# Patient Record
Sex: Female | Born: 1940 | Race: White | Hispanic: No | Marital: Married | Smoking: Former smoker
Health system: Southern US, Community
[De-identification: ages and names within clinical notes are randomized; demographics above are authoritative.]

## PROBLEM LIST (undated history)

## (undated) DIAGNOSIS — Z6839 Body mass index (BMI) 39.0-39.9, adult: Secondary | ICD-10-CM

## (undated) DIAGNOSIS — E785 Hyperlipidemia, unspecified: Secondary | ICD-10-CM

## (undated) DIAGNOSIS — M7130 Other bursal cyst, unspecified site: Secondary | ICD-10-CM

## (undated) DIAGNOSIS — M1990 Unspecified osteoarthritis, unspecified site: Secondary | ICD-10-CM

## (undated) DIAGNOSIS — E669 Obesity, unspecified: Secondary | ICD-10-CM

## (undated) DIAGNOSIS — F419 Anxiety disorder, unspecified: Secondary | ICD-10-CM

## (undated) DIAGNOSIS — E119 Type 2 diabetes mellitus without complications: Secondary | ICD-10-CM

## (undated) DIAGNOSIS — F32A Depression, unspecified: Secondary | ICD-10-CM

## (undated) DIAGNOSIS — F329 Major depressive disorder, single episode, unspecified: Secondary | ICD-10-CM

## (undated) DIAGNOSIS — G4733 Obstructive sleep apnea (adult) (pediatric): Secondary | ICD-10-CM

## (undated) DIAGNOSIS — R002 Palpitations: Secondary | ICD-10-CM

## (undated) DIAGNOSIS — I10 Essential (primary) hypertension: Secondary | ICD-10-CM

## (undated) DIAGNOSIS — J449 Chronic obstructive pulmonary disease, unspecified: Secondary | ICD-10-CM

## (undated) DIAGNOSIS — M4300 Spondylolysis, site unspecified: Secondary | ICD-10-CM

## (undated) DIAGNOSIS — M797 Fibromyalgia: Secondary | ICD-10-CM

## (undated) HISTORY — DX: Unspecified osteoarthritis, unspecified site: M19.90

## (undated) HISTORY — DX: Depression, unspecified: F32.A

## (undated) HISTORY — DX: Type 2 diabetes mellitus without complications: E11.9

## (undated) HISTORY — DX: Other bursal cyst, unspecified site: M71.30

## (undated) HISTORY — PX: PILONIDAL CYST EXCISION: SHX744

## (undated) HISTORY — DX: Hyperlipidemia, unspecified: E78.5

## (undated) HISTORY — DX: Obstructive sleep apnea (adult) (pediatric): G47.33

## (undated) HISTORY — DX: Fibromyalgia: M79.7

## (undated) HISTORY — DX: Anxiety disorder, unspecified: F41.9

## (undated) HISTORY — DX: Obesity, unspecified: E66.9

## (undated) HISTORY — DX: Essential (primary) hypertension: I10

## (undated) HISTORY — PX: TONSILLECTOMY: SUR1361

## (undated) HISTORY — DX: Body mass index (BMI) 39.0-39.9, adult: Z68.39

## (undated) HISTORY — DX: Palpitations: R00.2

## (undated) HISTORY — DX: Spondylolysis, site unspecified: M43.00

## (undated) HISTORY — DX: Chronic obstructive pulmonary disease, unspecified: J44.9

## (undated) HISTORY — DX: Major depressive disorder, single episode, unspecified: F32.9

## (undated) HISTORY — PX: COLONOSCOPY: SHX174

## (undated) HISTORY — PX: LAPAROSCOPIC TUBAL LIGATION: SUR803

## (undated) HISTORY — PX: POLYPECTOMY: SHX149

---

## 1998-03-16 ENCOUNTER — Other Ambulatory Visit: Admission: RE | Admit: 1998-03-16 | Discharge: 1998-03-16 | Payer: Self-pay | Admitting: Internal Medicine

## 2001-06-12 ENCOUNTER — Encounter (INDEPENDENT_AMBULATORY_CARE_PROVIDER_SITE_OTHER): Payer: Self-pay | Admitting: Specialist

## 2001-06-12 ENCOUNTER — Ambulatory Visit (HOSPITAL_COMMUNITY): Admission: RE | Admit: 2001-06-12 | Discharge: 2001-06-12 | Payer: Self-pay | Admitting: Gastroenterology

## 2007-07-29 ENCOUNTER — Other Ambulatory Visit: Admission: RE | Admit: 2007-07-29 | Discharge: 2007-07-29 | Payer: Self-pay | Admitting: Internal Medicine

## 2007-08-27 ENCOUNTER — Encounter (INDEPENDENT_AMBULATORY_CARE_PROVIDER_SITE_OTHER): Payer: Self-pay | Admitting: *Deleted

## 2007-08-27 ENCOUNTER — Ambulatory Visit (HOSPITAL_COMMUNITY): Admission: RE | Admit: 2007-08-27 | Discharge: 2007-08-27 | Payer: Self-pay | Admitting: *Deleted

## 2009-02-18 ENCOUNTER — Encounter: Admission: RE | Admit: 2009-02-18 | Discharge: 2009-05-19 | Payer: Self-pay | Admitting: Otolaryngology

## 2009-02-26 ENCOUNTER — Encounter: Admission: RE | Admit: 2009-02-26 | Discharge: 2009-02-26 | Payer: Self-pay | Admitting: Otolaryngology

## 2010-06-09 ENCOUNTER — Encounter: Admission: RE | Admit: 2010-06-09 | Discharge: 2010-06-09 | Payer: Self-pay | Admitting: Internal Medicine

## 2011-05-02 NOTE — Op Note (Signed)
[name], [name] NO.:  [id number]   MEDICAL RECORD NO.:  [id number]          PATIENT TYPE:  AMB   LOCATION:  ENDO                         FACILITY:  [facility]   PHYSICIAN:  Georgiana Spinner, M.D.    DATE OF BIRTH:  1941-01-10   DATE OF PROCEDURE:  08/27/2007  DATE OF DISCHARGE:                               OPERATIVE REPORT   PROCEDURE:  Upper endoscopy.   INDICATIONS:  GERD.   ANESTHESIA:  Demerol 50 and Versed 8 mg.   DESCRIPTION OF PROCEDURE:  With the patient mildly sedated in the left  lateral decubitus position, the Pentax videoscopic endoscope was  inserted in the mouth and passed under direct vision through the  esophagus which appeared normal.  The squamocolumnar junction could  never be well seen despite maneuvers to open it up, so I elected to  biopsy one area that I thought could be Barrett's esophagus.  This was  photographed and biopsied.  We entered into the stomach.  The fundus,  body, antrum, duodenal bulb, and second portion of the duodenum all  appeared normal.  From this point, the endoscope was slowly withdrawn  taking circumferential views of the duodenal mucosa until the endoscope  had been pulled back in the stomach and placed in retroflexion to view  the stomach from below. The endoscope was straightened and withdrawn  taking circumferential views of the remaining gastric and esophageal  mucosa.  The patient's vital signs and pulse oximeter remained stable.  The patient tolerated the procedure well without apparent complications.   FINDINGS:  Question of Barrett's esophagus, biopsied, await biopsy  report.  The patient will call me for results and follow up with me as  an outpatient.  Proceed to colonoscopy.           ______________________________  Georgiana Spinner, M.D.     GMO/MEDQ  D:  08/27/2007  T:  08/27/2007  Job:  [id number]

## 2011-05-02 NOTE — Op Note (Signed)
Anne Juarez, Anne Juarez                 ACCOUNT NO.:  [id number]   MEDICAL RECORD NO.:  [id number]          PATIENT TYPE:  AMB   LOCATION:  ENDO                         FACILITY:  [facility]   PHYSICIAN:  Georgiana Spinner, M.D.    DATE OF BIRTH:  04/06/1941   DATE OF PROCEDURE:  08/27/2007  DATE OF DISCHARGE:                               OPERATIVE REPORT   PROCEDURE:  Colonoscopy.   ANESTHESIA:  Demerol 50 mg, Versed 3 mg.   DESCRIPTION OF PROCEDURE:  With the patient mildly sedated in the left  lateral decubitus position and subsequently rolled to her back, the  Pentax videoscopic colonoscope was inserted in the rectum and passed  under direct vision with pressure applied to reach the cecum identified  by the ileocecal valve and base of the cecum, both which were  photographed.  The prep was slightly suboptimal in that there were  scattered areas of small amounts of opaque brown liquidy stool that had  to be suctioned. From this point, the colonoscope was slowly withdrawn  taking circumferential views of the colonic mucosa stopping first in the  proximal transverse colon where a small polyp was seen, photographed,  and removed using hot biopsy forceps technique at a setting of 20/150  blended current.  We next stopped in the rectum which appeared normal  except for a small polyp. That, too, was removed using hot biopsy  forceps technique at the same setting and appeared normal on retroflexed  view. The endoscope was straightened and withdrawn.  The patient's vital  signs and pulse oximeter remained stable.  The patient tolerated the  procedure well without apparent complications.   FINDINGS:  Slight thickening of the sigmoid colon due to diverticular  disease.  The diverticular orifices were never well seen, themselves,  however.  A polyp of rectum and polyp of proximal transverse colon.  Await biopsy report.  The patient will call me for results and follow-up  with me as an outpatient.  The  prep was slightly suboptimal as noted  above.   PLAN:  Await biopsy report.  The patient will call me for results and  follow-up with me as an outpatient.           ______________________________  Georgiana Spinner, M.D.     GMO/MEDQ  D:  08/27/2007  T:  08/27/2007  Job:  [id number]

## 2011-05-02 NOTE — Op Note (Signed)
NAMEHENNIE, GOSA NO.:  [id number]   MEDICAL RECORD NO.:  [id number]          PATIENT TYPE:  AMB   LOCATION:  ENDO                         FACILITY:  [facility]   PHYSICIAN:  Georgiana Spinner, M.D.    DATE OF BIRTH:  [birth year]   DATE OF PROCEDURE:  08/27/2007  DATE OF DISCHARGE:                               OPERATIVE REPORT   ADDENDUM:  Colonoscopy report:  AVM's were noted in the cecum.           ______________________________  Georgiana Spinner, M.D.     GMO/MEDQ  D:  08/27/2007  T:  08/27/2007  Job:  [id number]

## 2011-05-05 NOTE — Procedures (Signed)
[facility]  Patient:    Anne Juarez, Anne Juarez                          MRN: [id number] Proc. Date: 06/12/01 Attending:  Everardo All. Madilyn Fireman, M.D. CC:         Janae Bridgeman. Eloise Harman., M.D.   Procedure Report  PROCEDURE:  Colonoscopy with polypectomy.  GASTROENTEROLOGIST:  Everardo All. Madilyn Fireman, M.D.  INDICATIONS FOR PROCEDURE:  History of adenomatous colon polyps on index colonoscopy four years ago.  DESCRIPTION OF PROCEDURE:   The patient was placed in the left lateral decubitus position and placed on the pulse monitor with continuous low-flow oxygen delivered by nasal cannula.  She was sedated with 80 mg IV Demerol and 8 mg IV Versed.  The Olympus video colonoscope was inserted into the rectum and advanced to the cecum, confirmed by transillumination of McBurneys point and visualization of the ileocecal valve and appendiceal orifice.  The prep was good.  The cecum, ascending, and transverse colon appeared normal with no masses, polyps, diverticula, or other mucosal abnormalities.  In the descending colon, there was seen a single 1 cm polyp which was fulgurated by hot biopsy.  The sigmoid colon appeared normal.  Within the proximal rectum, there was another 1 cm polyp which was fulgurated by hot biopsy, and the remainder of the rectum appeared normal except for several diminutive 3 mm or less in diameter polyps in the distal 5 to 6 cm which were felt to be hyperplastic and were not biopsied.  The scope was then withdrawn, and the patient returned to the recovery room in stable condition.  He tolerated the procedure well, and there were no immediate complications.  IMPRESSIONS: 1. Descending and rectal polyps. 2. Otherwise normal colonoscopy.  PLAN:  Repeat colonoscopy in three years. DD:  06/12/01 TD:  06/12/01 Job: [id number]

## 2011-12-27 DIAGNOSIS — E119 Type 2 diabetes mellitus without complications: Secondary | ICD-10-CM | POA: Diagnosis not present

## 2012-01-02 DIAGNOSIS — E7800 Pure hypercholesterolemia, unspecified: Secondary | ICD-10-CM | POA: Diagnosis not present

## 2012-01-02 DIAGNOSIS — K861 Other chronic pancreatitis: Secondary | ICD-10-CM | POA: Diagnosis not present

## 2012-01-02 DIAGNOSIS — R7301 Impaired fasting glucose: Secondary | ICD-10-CM | POA: Diagnosis not present

## 2012-01-02 DIAGNOSIS — I10 Essential (primary) hypertension: Secondary | ICD-10-CM | POA: Diagnosis not present

## 2012-08-27 DIAGNOSIS — E7800 Pure hypercholesterolemia, unspecified: Secondary | ICD-10-CM | POA: Diagnosis not present

## 2012-08-27 DIAGNOSIS — R7301 Impaired fasting glucose: Secondary | ICD-10-CM | POA: Diagnosis not present

## 2012-08-27 DIAGNOSIS — I10 Essential (primary) hypertension: Secondary | ICD-10-CM | POA: Diagnosis not present

## 2012-08-27 DIAGNOSIS — K861 Other chronic pancreatitis: Secondary | ICD-10-CM | POA: Diagnosis not present

## 2012-08-27 DIAGNOSIS — Z0000 Encounter for general adult medical examination without abnormal findings: Secondary | ICD-10-CM | POA: Diagnosis not present

## 2012-09-03 DIAGNOSIS — IMO0001 Reserved for inherently not codable concepts without codable children: Secondary | ICD-10-CM | POA: Diagnosis not present

## 2012-09-03 DIAGNOSIS — H908 Mixed conductive and sensorineural hearing loss, unspecified: Secondary | ICD-10-CM | POA: Diagnosis not present

## 2012-09-03 DIAGNOSIS — R7301 Impaired fasting glucose: Secondary | ICD-10-CM | POA: Diagnosis not present

## 2012-09-03 DIAGNOSIS — M545 Low back pain, unspecified: Secondary | ICD-10-CM | POA: Diagnosis not present

## 2012-09-03 DIAGNOSIS — E7800 Pure hypercholesterolemia, unspecified: Secondary | ICD-10-CM | POA: Diagnosis not present

## 2012-09-03 DIAGNOSIS — I10 Essential (primary) hypertension: Secondary | ICD-10-CM | POA: Diagnosis not present

## 2012-09-03 DIAGNOSIS — Z23 Encounter for immunization: Secondary | ICD-10-CM | POA: Diagnosis not present

## 2012-09-10 DIAGNOSIS — M810 Age-related osteoporosis without current pathological fracture: Secondary | ICD-10-CM | POA: Diagnosis not present

## 2012-09-13 ENCOUNTER — Encounter: Payer: Self-pay | Admitting: Internal Medicine

## 2012-10-26 DIAGNOSIS — Z23 Encounter for immunization: Secondary | ICD-10-CM | POA: Diagnosis not present

## 2012-10-29 ENCOUNTER — Ambulatory Visit (AMBULATORY_SURGERY_CENTER): Payer: Medicare Other

## 2012-10-29 VITALS — Ht 62.0 in | Wt 217.6 lb

## 2012-10-29 DIAGNOSIS — Z1211 Encounter for screening for malignant neoplasm of colon: Secondary | ICD-10-CM

## 2012-10-29 DIAGNOSIS — Z800 Family history of malignant neoplasm of digestive organs: Secondary | ICD-10-CM

## 2012-10-29 DIAGNOSIS — Z86010 Personal history of colonic polyps: Secondary | ICD-10-CM

## 2012-10-29 MED ORDER — MOVIPREP 100 G PO SOLR: ORAL | Status: DC

## 2012-11-12 ENCOUNTER — Ambulatory Visit (AMBULATORY_SURGERY_CENTER): Payer: Medicare Other | Admitting: Internal Medicine

## 2012-11-12 ENCOUNTER — Encounter: Payer: Self-pay | Admitting: Internal Medicine

## 2012-11-12 VITALS — BP 109/55 | HR 75 | Temp 97.1°F | Resp 15 | Ht 62.0 in | Wt 217.0 lb

## 2012-11-12 DIAGNOSIS — I10 Essential (primary) hypertension: Secondary | ICD-10-CM | POA: Diagnosis not present

## 2012-11-12 DIAGNOSIS — F329 Major depressive disorder, single episode, unspecified: Secondary | ICD-10-CM | POA: Diagnosis not present

## 2012-11-12 DIAGNOSIS — Z86010 Personal history of colonic polyps: Secondary | ICD-10-CM | POA: Diagnosis not present

## 2012-11-12 DIAGNOSIS — Z800 Family history of malignant neoplasm of digestive organs: Secondary | ICD-10-CM | POA: Diagnosis not present

## 2012-11-12 DIAGNOSIS — Z1211 Encounter for screening for malignant neoplasm of colon: Secondary | ICD-10-CM

## 2012-11-12 DIAGNOSIS — E785 Hyperlipidemia, unspecified: Secondary | ICD-10-CM | POA: Diagnosis not present

## 2012-11-12 DIAGNOSIS — F3289 Other specified depressive episodes: Secondary | ICD-10-CM | POA: Diagnosis not present

## 2012-11-12 MED ORDER — SODIUM CHLORIDE 0.9 % IV SOLN: 500.0000 mL | INTRAVENOUS | Status: DC

## 2012-11-12 NOTE — Op Note (Addendum)
[facility] [address] [place], [address]   COLONOSCOPY PROCEDURE REPORT  PATIENT: Anne Juarez, Anne Juarez  MR#: [id number] BIRTHDATE: 21-Jul-1941 , 70  yrs. old GENDER: Female ENDOSCOPIST: Hart Carwin, MD REFERRED BY:  Georgianne Fick, M.D. PROCEDURE DATE:  11/12/2012 PROCEDURE:   Colonoscopy, surveillance ASA CLASS:   Class II INDICATIONS:hyperplactic polyp and tubular adenoma  in 2008,. MEDICATIONS: MAC sedation, administered by CRNA and propofol (Diprivan) 300mg  IV  DESCRIPTION OF PROCEDURE:   After the risks and benefits and of the procedure were explained, informed consent was obtained.  A digital rectal exam revealed no abnormalities of the rectum.    The LB [id number]-[id number]  endoscope was introduced through the anus and advanced to the cecum, which was identified by both the appendix and ileocecal valve .  The quality of the prep was good, using MoviPrep .  The instrument was then slowly withdrawn as the colon was fully examined.     COLON FINDINGS: Moderate diverticulosis was noted.     Retroflexed views revealed no abnormalities.     The scope was then withdrawn from the patient and the procedure completed.  COMPLICATIONS: There were no complications. ENDOSCOPIC IMPRESSION: Moderate diverticulosis was noted  RECOMMENDATIONS: High fiber diet   REPEAT EXAM: In 10 year(s)  for Colonoscopy.  cc:  _______________________________ eSignedHart Carwin, MD 11/12/2012 11:10 AM

## 2012-11-12 NOTE — Progress Notes (Signed)
Patient did not experience any of the following events: a burn prior to discharge; a fall within the facility; wrong site/side/patient/procedure/implant event; or a hospital transfer or hospital admission upon discharge from the facility. (G8907) Patient did not have preoperative order for IV antibiotic SSI prophylaxis. (G8918)  

## 2012-11-12 NOTE — Patient Instructions (Signed)
HIGH FIBER DIET WITH LIBERAL FLUID INTAKE.  YOU HAD AN ENDOSCOPIC PROCEDURE TODAY AT THE [facility]: Refer to the procedure report that was given to you for any specific questions about what was found during the examination.  If the procedure report does not answer your questions, please call your gastroenterologist to clarify.  If you requested that your care partner not be given the details of your procedure findings, then the procedure report has been included in a sealed envelope for you to review at your convenience later.  YOU SHOULD EXPECT: Some feelings of bloating in the abdomen. Passage of more gas than usual.  Walking can help get rid of the air that was put into your GI tract during the procedure and reduce the bloating. If you had a lower endoscopy (such as a colonoscopy or flexible sigmoidoscopy) you may notice spotting of blood in your stool or on the toilet paper. If you underwent a bowel prep for your procedure, then you may not have a normal bowel movement for a few days.  DIET: Your first meal following the procedure should be a light meal and then it is ok to progress to your normal diet.  A half-sandwich or bowl of soup is an example of a good first meal.  Heavy or fried foods are harder to digest and may make you feel nauseous or bloated.  Likewise meals heavy in dairy and vegetables can cause extra gas to form and this can also increase the bloating.  Drink plenty of fluids but you should avoid alcoholic beverages for 24 hours.  ACTIVITY: Your care partner should take you home directly after the procedure.  You should plan to take it easy, moving slowly for the rest of the day.  You can resume normal activity the day after the procedure however you should NOT DRIVE or use heavy machinery for 24 hours (because of the sedation medicines used during the test).    SYMPTOMS TO REPORT IMMEDIATELY: A gastroenterologist can be reached at any hour.  During normal business  hours, 8:30 AM to 5:00 PM Monday through Friday, call [phone].  After hours and on weekends, please call the GI answering service at [phone] who will take a message and have the physician on call contact you.   Following lower endoscopy (colonoscopy or flexible sigmoidoscopy):  Excessive amounts of blood in the stool  Significant tenderness or worsening of abdominal pains  Swelling of the abdomen that is new, acute  Fever of 100F or higher FOLLOW UP: If any biopsies were taken you will be contacted by phone or by letter within the next 1-3 weeks.  Call your gastroenterologist if you have not heard about the biopsies in 3 weeks.  Our staff will call the home number listed on your records the next business day following your procedure to check on you and address any questions or concerns that you may have at that time regarding the information given to you following your procedure. This is a courtesy call and so if there is no answer at the home number and we have not heard from you through the emergency physician on call, we will assume that you have returned to your regular daily activities without incident.  SIGNATURES/CONFIDENTIALITY: You and/or your care partner have signed paperwork which will be entered into your electronic medical record.  These signatures attest to the fact that that the information above on your After Visit Summary has been reviewed and is understood.    Full responsibility of the confidentiality of this discharge information lies with you and/or your care-partner.  

## 2012-11-13 ENCOUNTER — Telehealth: Payer: Self-pay | Admitting: *Deleted

## 2012-11-13 NOTE — Telephone Encounter (Signed)
No answer, left message to call is questions or concerns. 

## 2012-11-18 DIAGNOSIS — H8110 Benign paroxysmal vertigo, unspecified ear: Secondary | ICD-10-CM | POA: Diagnosis not present

## 2012-11-18 DIAGNOSIS — R42 Dizziness and giddiness: Secondary | ICD-10-CM | POA: Diagnosis not present

## 2013-03-10 DIAGNOSIS — I10 Essential (primary) hypertension: Secondary | ICD-10-CM | POA: Diagnosis not present

## 2013-03-10 DIAGNOSIS — R7301 Impaired fasting glucose: Secondary | ICD-10-CM | POA: Diagnosis not present

## 2013-03-10 DIAGNOSIS — E7800 Pure hypercholesterolemia, unspecified: Secondary | ICD-10-CM | POA: Diagnosis not present

## 2013-03-10 DIAGNOSIS — IMO0001 Reserved for inherently not codable concepts without codable children: Secondary | ICD-10-CM | POA: Diagnosis not present

## 2013-03-17 DIAGNOSIS — R079 Chest pain, unspecified: Secondary | ICD-10-CM | POA: Diagnosis not present

## 2013-03-17 DIAGNOSIS — E7800 Pure hypercholesterolemia, unspecified: Secondary | ICD-10-CM | POA: Diagnosis not present

## 2013-03-17 DIAGNOSIS — R0602 Shortness of breath: Secondary | ICD-10-CM | POA: Diagnosis not present

## 2013-03-17 DIAGNOSIS — I10 Essential (primary) hypertension: Secondary | ICD-10-CM | POA: Diagnosis not present

## 2013-03-18 DIAGNOSIS — R0609 Other forms of dyspnea: Secondary | ICD-10-CM | POA: Diagnosis not present

## 2013-03-18 DIAGNOSIS — R0989 Other specified symptoms and signs involving the circulatory and respiratory systems: Secondary | ICD-10-CM | POA: Diagnosis not present

## 2013-03-18 DIAGNOSIS — R0602 Shortness of breath: Secondary | ICD-10-CM | POA: Diagnosis not present

## 2013-03-20 DIAGNOSIS — R0602 Shortness of breath: Secondary | ICD-10-CM | POA: Diagnosis not present

## 2013-04-07 DIAGNOSIS — I10 Essential (primary) hypertension: Secondary | ICD-10-CM | POA: Diagnosis not present

## 2013-04-07 DIAGNOSIS — E7800 Pure hypercholesterolemia, unspecified: Secondary | ICD-10-CM | POA: Diagnosis not present

## 2013-04-07 DIAGNOSIS — R0602 Shortness of breath: Secondary | ICD-10-CM | POA: Diagnosis not present

## 2013-04-21 ENCOUNTER — Encounter: Payer: Self-pay | Admitting: Internal Medicine

## 2013-04-22 ENCOUNTER — Ambulatory Visit (INDEPENDENT_AMBULATORY_CARE_PROVIDER_SITE_OTHER): Payer: Medicare Other | Admitting: Internal Medicine

## 2013-04-22 ENCOUNTER — Encounter: Payer: Self-pay | Admitting: Internal Medicine

## 2013-04-22 VITALS — BP 122/70 | HR 84 | Temp 98.1°F | Ht 62.0 in | Wt 218.2 lb

## 2013-04-22 DIAGNOSIS — R059 Cough, unspecified: Secondary | ICD-10-CM | POA: Diagnosis not present

## 2013-04-22 DIAGNOSIS — R05 Cough: Secondary | ICD-10-CM

## 2013-04-22 DIAGNOSIS — R0989 Other specified symptoms and signs involving the circulatory and respiratory systems: Secondary | ICD-10-CM | POA: Diagnosis not present

## 2013-04-22 DIAGNOSIS — R0609 Other forms of dyspnea: Secondary | ICD-10-CM | POA: Diagnosis not present

## 2013-04-22 DIAGNOSIS — R0600 Dyspnea, unspecified: Secondary | ICD-10-CM

## 2013-04-22 DIAGNOSIS — R053 Chronic cough: Secondary | ICD-10-CM

## 2013-04-22 NOTE — Patient Instructions (Addendum)
Please have PFT breathing test asap Please do oxygen test saturation at night on room air Return to see me next few weeks after above At followup  - do walk test for oxygen  - do cough score

## 2013-04-22 NOTE — Progress Notes (Signed)
Subjective:    Patient ID: Anne Juarez, female    DOB: Mar 23, 1941, 72 y.o.   MRN: [id number] PCP Georgianne Fick, MD    HPI IOV 04/22/2013 72 year old female. Ex 112 pack smoker. Quit smoking 2002/2003. Obese with BMI nearly 40.  Reports insidious onset of dyspnea few to several years. Marland Kitchen Dyspnea is progressive esp in last 3 months and more so last months. Currently gets dyspneic with even taking shower or even bending over and petting cat/dog.  There is some associated lightheadedness x 1 episode last week (bp check was normal but thinks she was tachycardic). She denies this severity of symptoims a year ago. Dyspnea improved by rest "sitting still" or "Stand still" and partially by albuerol. There is no orthopnea or pnd. Dyspnea is rated as moderate- severe  There is associated cough x 2 months. Cough stable since onset. Cough is reported as moderate. Quality is dry but recently clear mucus coming out.  There is no associated sinus drainge or gerd.    Both associated with wheeze, . Both not associated with tickle in throat or gagging.    Cough/dsypnea relevant hx  - Sinus   - denies hx for sinus issues - Has chronic hoarseness of voice  - GERD  - reportes active gerd esp after meals and lyuing supine. Rx is "not eat 2h before I got to bed". Not on drug Rx.  - she is noted to be on fish oil  - Pulmonary   - age 72 - 29 reports lot of "pneumonia or bronchitis". No dx of copd or asthma that is active   - General  - got obese after menopause. Progressive weight gain 100# in 22 years (slow and steady). Recollects being in great shape. . Unable to exercise to lose weight due to dyspnea  -   CXR 03/17/13 at pmd office - hyperfinlation (personally reviewed image)      Past Medical History  Diagnosis Date  . Hyperlipidemia   . Hypertension   . Anxiety   . Depression   . 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 Colon cancer Sister   . Liver cancer Brother   . Stomach cancer Paternal Aunt   . Esophageal cancer Neg Hx   . Rectal cancer Neg Hx      History   Social History  . Marital Status: Married    Spouse Name: N/A    Number of Children: N/A  . Years of Education: N/A   Occupational History  . Not on file.   Social History Main Topics  . Smoking status: Former Smoker -- 2.50 packs/day for 45 years    Types: Cigarettes    Quit date: 03/18/2002  . Smokeless tobacco: Never Used  . Alcohol Use: No  . Drug Use: No  . Sexually Active: Not on file   Other Topics Concern  . Not on file   Social History Narrative  . No narrative on file     No Known Allergies   Outpatient Prescriptions Prior to Visit  Medication Sig Dispense Refill  . amLODipine (NORVASC) 5 MG tablet Take 5 mg by mouth daily.      Marland Kitchen atorvastatin (LIPITOR) 40 MG tablet Take 40 mg by mouth daily.      . Cholecalciferol (VITAMIN D-3) 1000 UNITS CAPS Take by mouth daily.      Marland Kitchen  cyanocobalamin 1000 MCG tablet Take 100 mcg by mouth daily.      . cyclobenzaprine (FLEXERIL) 10 MG tablet Take 10 mg by mouth 3 (three) times daily as needed.       . DULoxetine (CYMBALTA) 30 MG capsule Take 30 mg by mouth daily.      . fish oil-omega-3 fatty acids 1000 MG capsule Take 2 g by mouth daily.      Marland Kitchen MAGNESIUM PO Take by mouth. Magnesium with zinc-Take 3 daily      . Milk Thistle 1000 MG CAPS Take by mouth daily.      . Multiple Vitamins-Minerals (CENTRUM SILVER PO) Take by mouth daily.      . Potassium Gluconate 2 MEQ TABS Take by mouth daily.      Marland Kitchen telmisartan-hydrochlorothiazide (MICARDIS HCT) 80-25 MG per tablet Take 1 tablet by mouth daily.      . Turmeric Curcumin 500 MG CAPS Take by mouth. Take 2 daily      . vitamin C (ASCORBIC ACID) 500 MG tablet Take 500 mg by mouth daily.      . vitamin E 400 UNIT capsule Take 400 Units by mouth daily.      . Coenzyme  Q10 (CO Q 10) 100 MG CAPS Take by mouth daily.      Marland Kitchen pyridoxine (B-6) 100 MG tablet Take 100 mg by mouth daily.       No facility-administered medications prior to visit.     Review of Systems  Constitutional: Positive for unexpected weight change. Negative for fever.  HENT: Negative for ear pain, nosebleeds, congestion, sore throat, rhinorrhea, sneezing, trouble swallowing, dental problem, postnasal drip and sinus pressure.   Eyes: Negative for redness and itching.  Respiratory: Positive for cough and shortness of breath. Negative for chest tightness and wheezing.   Cardiovascular: Positive for palpitations. Negative for leg swelling.  Gastrointestinal: Negative for nausea and vomiting.  Genitourinary: Negative for dysuria.  Musculoskeletal: Negative for joint swelling.  Skin: Negative for rash.  Neurological: Negative for headaches.  Hematological: Does not bruise/bleed easily.  Psychiatric/Behavioral: Positive for dysphoric mood. The patient is not nervous/anxious.        Objective:   Physical Exam  Vitals reviewed. Constitutional: She is oriented to person, place, and time. She appears well-developed and well-nourished. No distress.  Body mass index is 39.9 kg/(m^2).   HENT:  Head: Normocephalic and atraumatic.  Right Ear: External ear normal.  Left Ear: External ear normal.  Mouth/Throat: Oropharynx is clear and moist. No oropharyngeal exudate.  Squeaky voice  Eyes: Conjunctivae and EOM are normal. Pupils are equal, round, and reactive to light. Right eye exhibits no discharge. Left eye exhibits no discharge. No scleral icterus.  Neck: Normal range of motion. Neck supple. No JVD present. No tracheal deviation present. No thyromegaly present.  Cardiovascular: Normal rate, regular rhythm, normal heart sounds and intact distal pulses.  Exam reveals no gallop and no friction rub.   No murmur heard. Pulmonary/Chest: Effort normal and breath sounds normal. No respiratory  distress. She has no wheezes. She has no rales. She exhibits no tenderness.  Abdominal: Soft. Bowel sounds are normal. She exhibits no distension and no mass. There is no tenderness. There is no rebound and no guarding.  Musculoskeletal: Normal range of motion. She exhibits no edema and no tenderness.  Lymphadenopathy:    She has no cervical adenopathy.  Neurological: She is alert and oriented to person, place, and time. She has normal reflexes. No cranial nerve  deficit. She exhibits normal muscle tone. Coordination normal.  Skin: Skin is warm and dry. No rash noted. She is not diaphoretic. No erythema. No pallor.  Psychiatric: Judgment and thought content normal.  Anxious Flat affect          Assessment & Plan:

## 2013-04-23 ENCOUNTER — Ambulatory Visit (INDEPENDENT_AMBULATORY_CARE_PROVIDER_SITE_OTHER): Payer: Medicare Other | Admitting: Internal Medicine

## 2013-04-23 DIAGNOSIS — R0609 Other forms of dyspnea: Secondary | ICD-10-CM

## 2013-04-23 DIAGNOSIS — R0989 Other specified symptoms and signs involving the circulatory and respiratory systems: Secondary | ICD-10-CM | POA: Diagnosis not present

## 2013-04-23 DIAGNOSIS — R0600 Dyspnea, unspecified: Secondary | ICD-10-CM

## 2013-04-23 LAB — PULMONARY FUNCTION TEST
DL/VA % pred: 92 %
DL/VA: 3.93 ml/min/mmHg/L
DLCO unc % pred: 95 %
DLCO unc: 18.04 ml/min/mmHg
FEF 25-75 Post: 1.97 L/sec
FEF 25-75 Pre: 1.46 L/sec
FEF2575-%Change-Post: 34 %
FEF2575-%Pred-Post: 126 %
FEF2575-%Pred-Pre: 93 %
FEV1-%Change-Post: 7 %
FEV1-%Pred-Post: 109 %
FEV1-%Pred-Pre: 101 %
FEV1-Post: 2 L
FEV1-Pre: 1.86 L
FEV1FVC-%Change-Post: 3 %
FEV1FVC-%Pred-Pre: 101 %
FEV6-%Change-Post: 3 %
FEV6-%Pred-Post: 108 %
FEV6-%Pred-Pre: 105 %
FEV6-Post: 2.53 L
FEV6-Pre: 2.44 L
FEV6FVC-%Change-Post: 0 %
FEV6FVC-%Pred-Post: 104 %
FEV6FVC-%Pred-Pre: 104 %
FVC-%Change-Post: 3 %
FVC-%Pred-Post: 104 %
FVC-%Pred-Pre: 100 %
FVC-Post: 2.54 L
Post FEV1/FVC ratio: 79 %
Post FEV6/FVC ratio: 99 %
Pre FEV1/FVC ratio: 76 %
Pre FEV6/FVC Ratio: 100 %
RV % pred: 128 %
RV: 2.61 L
TLC % pred: 109 %
TLC: 4.88 L

## 2013-04-23 NOTE — Progress Notes (Signed)
PFT done today. 

## 2013-04-25 DIAGNOSIS — R05 Cough: Secondary | ICD-10-CM | POA: Insufficient documentation

## 2013-04-25 DIAGNOSIS — R0600 Dyspnea, unspecified: Secondary | ICD-10-CM | POA: Insufficient documentation

## 2013-04-25 DIAGNOSIS — R053 Chronic cough: Secondary | ICD-10-CM | POA: Insufficient documentation

## 2013-04-25 NOTE — Assessment & Plan Note (Signed)
Unclear etiology for dyspnea. Differential diagnoses include smoking-related diseases, deconditioning, obesity. In order to sort all this out I will obtain pulmonary function test on overnight oxygen to saturation test on room air. I will see her after these 2 tests regroup and her decide the next test. She is in agreement with this plan

## 2013-04-25 NOTE — Assessment & Plan Note (Signed)
I will check her cough score at followup

## 2013-04-29 ENCOUNTER — Telehealth: Payer: Self-pay | Admitting: Internal Medicine

## 2013-04-29 DIAGNOSIS — R0600 Dyspnea, unspecified: Secondary | ICD-10-CM

## 2013-04-29 DIAGNOSIS — R0602 Shortness of breath: Secondary | ICD-10-CM | POA: Diagnosis not present

## 2013-04-29 NOTE — Telephone Encounter (Signed)
Called, spoke with [facility] with [employer]. Mandy faxed over pt's ONO on RA this morning. Reports pt had 169 minutes below 88% and 255 minutes below 89%. Will route msg to MR.   Pls advise if you have results and if you would like to order o2.  Thank you.

## 2013-04-29 NOTE — Telephone Encounter (Signed)
pft 5/7'14 is completely normal. So puzzled she desaturates at night. To sort this out  A) please set up CT chest ILD  Protocol wo contrast  B) sleep doctor referral in our office  Fu to see me after Ct chest (she can see sleep doc whenever)  Dr. Kalman Shan, M.D., [facility].C.P Pulmonary and Critical Care Medicine Staff Physician [facility] Pager: [phone], If no answer or between  15:00h - 7:00h: call [phone]  04/29/2013 5:26 PM

## 2013-04-30 NOTE — Telephone Encounter (Signed)
Pt is aware that she will need to have a CT done and why. Anne Juarez has spoken to her and given her the details of when and where she will have it done.

## 2013-05-05 ENCOUNTER — Ambulatory Visit (INDEPENDENT_AMBULATORY_CARE_PROVIDER_SITE_OTHER)
Admission: RE | Admit: 2013-05-05 | Discharge: 2013-05-05 | Disposition: A | Discharge status: Home / Self Care | Payer: Medicare Other | Source: Ambulatory Visit | Attending: Internal Medicine | Admitting: Internal Medicine

## 2013-05-05 DIAGNOSIS — R0600 Dyspnea, unspecified: Secondary | ICD-10-CM

## 2013-05-05 DIAGNOSIS — R0609 Other forms of dyspnea: Secondary | ICD-10-CM | POA: Diagnosis not present

## 2013-05-05 DIAGNOSIS — R0989 Other specified symptoms and signs involving the circulatory and respiratory systems: Secondary | ICD-10-CM

## 2013-05-05 DIAGNOSIS — R918 Other nonspecific abnormal finding of lung field: Secondary | ICD-10-CM | POA: Diagnosis not present

## 2013-05-05 DIAGNOSIS — J438 Other emphysema: Secondary | ICD-10-CM | POA: Diagnosis not present

## 2013-05-20 ENCOUNTER — Encounter: Payer: Self-pay | Admitting: Pulmonary Disease

## 2013-05-20 ENCOUNTER — Ambulatory Visit (INDEPENDENT_AMBULATORY_CARE_PROVIDER_SITE_OTHER): Payer: Medicare Other | Admitting: Pulmonary Disease

## 2013-05-20 VITALS — BP 122/76 | HR 90 | Temp 97.9°F | Ht 62.0 in | Wt 219.8 lb

## 2013-05-20 DIAGNOSIS — J438 Other emphysema: Secondary | ICD-10-CM

## 2013-05-20 DIAGNOSIS — R911 Solitary pulmonary nodule: Secondary | ICD-10-CM | POA: Diagnosis not present

## 2013-05-20 DIAGNOSIS — R0902 Hypoxemia: Secondary | ICD-10-CM

## 2013-05-20 DIAGNOSIS — J439 Emphysema, unspecified: Secondary | ICD-10-CM | POA: Insufficient documentation

## 2013-05-20 DIAGNOSIS — J209 Acute bronchitis, unspecified: Secondary | ICD-10-CM | POA: Diagnosis not present

## 2013-05-20 DIAGNOSIS — G4734 Idiopathic sleep related nonobstructive alveolar hypoventilation: Secondary | ICD-10-CM | POA: Insufficient documentation

## 2013-05-20 MED ORDER — TIOTROPIUM BROMIDE MONOHYDRATE 18 MCG IN CAPS: 18.0000 ug | ORAL_CAPSULE | Freq: Every day | RESPIRATORY_TRACT | Status: DC

## 2013-05-20 MED ORDER — DOXYCYCLINE HYCLATE 100 MG PO CAPS: 100.0000 mg | ORAL_CAPSULE | Freq: Two times a day (BID) | ORAL | Status: DC

## 2013-05-20 MED ORDER — PREDNISONE 5 MG PO TABS: ORAL_TABLET | ORAL | Status: DC

## 2013-05-20 NOTE — Patient Instructions (Signed)
Prednisone 5 mg pill >> 4 pills for 2 days, 3 pills for 2 days, 2 pills for 2 days, 1 pill for 2 days Doxycycline 100 mg pill >> one pill twice per day for 7 days Spiriva one puff daily Proair two puffs up to four times per day as needed for cough, wheeze, or chest congestion Follow up with Dr. Marchelle Gearing in 1 to 2 weeks

## 2013-05-20 NOTE — Assessment & Plan Note (Signed)
Recent ONO showed nocturnal hypoxemia.  She is to follow up with Dr. Marchelle Gearing to discuss implications of this.

## 2013-05-20 NOTE — Assessment & Plan Note (Signed)
Will give course of prednisone and doxycycline. 

## 2013-05-20 NOTE — Assessment & Plan Note (Signed)
Reviewed CT chest findings with her.  She will likely need radiographic follow up >> this can be discussed in more detail at follow up with Dr. Marchelle Gearing.

## 2013-05-20 NOTE — Assessment & Plan Note (Signed)
Will give trial of spiriva >> gave samples.  She can continue prn albuterol.

## 2013-05-20 NOTE — Progress Notes (Signed)
Chief Complaint  Patient presents with  . Acute Visit    MR pt. Pt reports she has infection in her chest. She states she coughs a lot but gets very little phlem up up. she gets up yellow-grey phlem, she has SOB, wheezing and chest tx. Per pt she is getting worse.     History of Present Illness: Anne Juarez is a 72 y.o. female former smoker with chronic cough and dyspnea.  She is followed by Dr. Marchelle Gearing.  She has been feeling sick for the past 6 months.  She has cough that is usually dry.  Over the past two weeks she has cough productive of yellow to gray sputum.  She has been getting wheeze in her chest.  Her chest is sore when she coughs, and she will retch sometimes when she coughs.  She denies hemoptysis, headache, sinus congestion, reflux, or sore throat.  She has not had any sick exposures.  She has not smoked in years.  She used to get pneumonia frequently when she was younger.  She has an albuterol inhaler, but the benefit does not last very long.  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  Anne Juarez  has past surgical history that includes Laparoscopic tubal ligation; Tonsillectomy; Pilonidal cyst excision; Colonoscopy; and Polypectomy.  Prior to Admission medications   Medication Sig Start Date End Date Taking? Authorizing Provider  albuterol (PROAIR HFA) 108 (90 BASE) MCG/ACT inhaler Inhale 2 puffs into the lungs every 6 (six) hours as needed for wheezing.   Yes Historical Provider, MD  amLODipine (NORVASC) 5 MG tablet Take 5 mg by mouth daily.   Yes Historical Provider, MD  atorvastatin (LIPITOR) 40 MG tablet Take 40 mg by mouth daily.   Yes Historical Provider, MD  Cholecalciferol (VITAMIN D-3) 1000  UNITS CAPS Take by mouth daily.   Yes Historical Provider, MD  cyanocobalamin 1000 MCG tablet Take 100 mcg by mouth daily.   Yes Historical Provider, MD  cyclobenzaprine (FLEXERIL) 10 MG tablet Take 10 mg by mouth 3 (three) times daily as needed.    Yes Historical Provider, MD  DULoxetine (CYMBALTA) 30 MG capsule Take 30 mg by mouth daily.   Yes Historical Provider, MD  fish oil-omega-3 fatty acids 1000 MG capsule Take 2 g by mouth daily.   Yes Historical Provider, MD  MAGNESIUM PO Take by mouth. Magnesium with zinc-Take 3 daily   Yes Historical Provider, MD  Milk Thistle 1000 MG CAPS Take by mouth daily.   Yes Historical Provider, MD  Multiple Vitamins-Minerals (CENTRUM SILVER PO) Take by mouth daily.   Yes Historical Provider, MD  Potassium Gluconate 2 MEQ TABS Take by mouth daily.   Yes Historical Provider, MD  pyridoxine (B-6) 100 MG tablet Take 100 mg by mouth daily.   Yes Historical Provider, MD  telmisartan-hydrochlorothiazide (MICARDIS HCT) 80-25 MG per tablet Take 1 tablet by mouth daily.   Yes Historical Provider, MD  Turmeric Curcumin 500 MG CAPS Take by mouth. Take 2 daily   Yes Historical Provider, MD  vitamin C (ASCORBIC ACID) 500 MG tablet Take 500 mg by mouth daily.   Yes Historical Provider, MD  vitamin E 400 UNIT capsule Take 400 Units by mouth daily.   Yes Historical Provider,  MD    No Known Allergies   Physical Exam:  General - No distress ENT - No sinus tenderness, no oral exudate, no LAN Cardiac - s1s2 regular, no murmur Chest - coarse breath sounds with scattered rhonchi Back - No focal tenderness Abd - Soft, non-tender Ext - No edema Neuro - Normal strength Skin - No rashes Psych - normal mood, and behavior   Assessment/Plan:  Coralyn Helling, MD [facility] Pager:  [phone]

## 2013-05-30 ENCOUNTER — Encounter: Payer: Self-pay | Admitting: Internal Medicine

## 2013-06-02 ENCOUNTER — Encounter: Payer: Self-pay | Admitting: Internal Medicine

## 2013-06-02 ENCOUNTER — Telehealth: Payer: Self-pay | Admitting: Internal Medicine

## 2013-06-02 ENCOUNTER — Ambulatory Visit (INDEPENDENT_AMBULATORY_CARE_PROVIDER_SITE_OTHER): Payer: Medicare Other | Admitting: Internal Medicine

## 2013-06-02 VITALS — BP 134/78 | HR 75 | Temp 97.7°F | Ht 62.0 in | Wt 220.4 lb

## 2013-06-02 DIAGNOSIS — R911 Solitary pulmonary nodule: Secondary | ICD-10-CM | POA: Diagnosis not present

## 2013-06-02 DIAGNOSIS — I2584 Coronary atherosclerosis due to calcified coronary lesion: Secondary | ICD-10-CM | POA: Diagnosis not present

## 2013-06-02 DIAGNOSIS — R053 Chronic cough: Secondary | ICD-10-CM

## 2013-06-02 DIAGNOSIS — R0902 Hypoxemia: Secondary | ICD-10-CM

## 2013-06-02 DIAGNOSIS — J438 Other emphysema: Secondary | ICD-10-CM

## 2013-06-02 DIAGNOSIS — I2510 Atherosclerotic heart disease of native coronary artery without angina pectoris: Secondary | ICD-10-CM

## 2013-06-02 DIAGNOSIS — R05 Cough: Secondary | ICD-10-CM

## 2013-06-02 DIAGNOSIS — J439 Emphysema, unspecified: Secondary | ICD-10-CM

## 2013-06-02 DIAGNOSIS — R0609 Other forms of dyspnea: Secondary | ICD-10-CM | POA: Diagnosis not present

## 2013-06-02 DIAGNOSIS — R059 Cough, unspecified: Secondary | ICD-10-CM

## 2013-06-02 DIAGNOSIS — G4734 Idiopathic sleep related nonobstructive alveolar hypoventilation: Secondary | ICD-10-CM

## 2013-06-02 DIAGNOSIS — R0989 Other specified symptoms and signs involving the circulatory and respiratory systems: Secondary | ICD-10-CM

## 2013-06-02 DIAGNOSIS — R0600 Dyspnea, unspecified: Secondary | ICD-10-CM

## 2013-06-02 MED ORDER — TIOTROPIUM BROMIDE MONOHYDRATE 18 MCG IN CAPS: 18.0000 ug | ORAL_CAPSULE | Freq: Every day | RESPIRATORY_TRACT | Status: DC

## 2013-06-02 NOTE — Progress Notes (Signed)
Subjective:    Patient ID: Anne Juarez, female    DOB: 1941/02/05, 72 y.o.   MRN: [id number]  HPI  PCP Anne Fick, MD   IOV 04/22/13 72 year old female. Ex 112 pack smoker. Quit smoking 2002/2003. Obese with BMI nearly 40.  Reports insidious onset of dyspnea few to several years. Marland Kitchen Dyspnea is progressive esp in last 3 months and more so last months. Currently gets dyspneic with even taking shower or even bending over and petting cat/dog.  There is some associated lightheadedness x 1 episode last week (bp check was normal but thinks she was tachycardic). She denies this severity of symptoims a year ago. Dyspnea improved by rest "sitting still" or "Stand still" and partially by albuerol. There is no orthopnea or pnd. Dyspnea is rated as moderate- severe  There is associated cough x 2 months. Cough stable since onset. Cough is reported as moderate. Quality is dry but recently clear mucus coming out.  There is no associated sinus drainge or gerd.    Both associated with wheeze, . Both not associated with tickle in throat or gagging.    Cough/dsypnea relevant hx  - Sinus   - denies hx for sinus issues - Has chronic hoarseness of voice  - GERD  - reportes active gerd esp after meals and lyuing supine. Rx is "not eat 2h before I got to bed". Not on drug Rx.  - she is noted to be on fish oil  - Pulmonary   - age 26 - 29 reports lot of "pneumonia or bronchitis". No dx of copd or asthma that is active   - General  - got obese after menopause. Progressive weight gain 100# in 22 years (slow and steady). Recollects being in great shape. . Unable to exercise to lose weight due to dyspnea  -   CXR 03/17/13 at pmd office - hyperfinlation (personally reviewed image)  REC Please have PFT breathing test asap Please do oxygen test saturation at night on room air Return to see me next few weeks after above At followup  - do walk test for oxygen  - do cough score   Telephone call  04/29/13 pft 5/7'14 is completely normal. So puzzled she desaturates at night. To sort this out  A) please set up CT chest ILD Protocol wo contrast  B) sleep doctor referral in our office  Fu to see me after Ct chest (she can see sleep doc whenever)      Acute OV 05/20/13: Anne Juarez  Anne Juarez is a 72 y.o. female former smoker with chronic cough and dyspnea.  She is followed by Anne. Marchelle Juarez.  She has been feeling sick for the past 6 months.  She has cough that is usually dry.  Over the past two weeks she has cough productive of yellow to gray sputum.  She has been getting wheeze in her chest.  Her chest is sore when she coughs, and she will retch sometimes when she coughs.  She denies hemoptysis, headache, sinus congestion, reflux, or sore throat.  She has not had any sick exposures.  She has not smoked in years.  She used to get pneumonia frequently when she was younger.  She has an albuterol inhaler, but the benefit does not last very long.  PFT 04/23/13 >> FEV1 2.0 (109%), FEV1% 79, TLC 4.48 (109%), DLCO 95% CT chest 05/14/13 >> mild centrilobular emphysema, coronary calcifications, 6 mm RML nodule   REC Prednisone 5 mg pill >> 4 pills for 2  days, 3 pills for 2 days, 2 pills for 2 days, 1 pill for 2 days Doxycycline 100 mg pill >> one pill twice per day for 7 days Spiriva one puff daily Proair two puffs up to four times per day as needed for cough, wheeze, or chest congestion Follow up with Anne. Marchelle Juarez in 1 to 2 weeks   OV 06/02/2013 She is presenting after starting dyspnea workup.  Dyspnea and cough persist. Empiric Spiriva helping only somewhat.  SOme better after spiriva given by Anne Juarez. Currently RSI cough score is 22 and suggestive of irritable larynx syndrome [details below]  CT shows lung nodule 6mm RML, co calcifications and mild emphysema   PFT 04/23/13 >> FEV1 2.0 (109%), FEV1% 79, TLC 4.48 (109%), DLCO 95%  Overnight desaturation test mid May 2014: ONO on RA  this morning. Reports pt had 169 minutes below 88% and 255 minutes below 89%. She reports significant associated snoring. Had set her up to see Anne Juarez for sleep evaluation but it so ended up that her most recent visit with him was for acute respiratory symptomatology so the sleep part was not evaluated   Anne Juarez Reflux Symptom Index (> 13-15 suggestive of LPR cough) 0 -> 5  =  none ->severe problem 06/02/2013   Hoarseness of problem with voice 0  Clearing  Of Throat 0  Excess throat mucus or feeling of post nasal drip 1  Difficulty swallowing food, liquid or tablets 2  Cough after eating or lying down 3  Breathing difficulties or choking episodes 5  Troublesome or annoying cough 5  Sensation of something sticking in throat or lump in throat 3  Heartburn, chest pain, indigestion, or stomach acid coming up 3  TOTAL 22     Kouffman Reflux v Neurogenic Cough Differentiator Reflux 06/02/13  Do you awaken from a sound sleep coughing violently?                            With trouble breathing? n  Do you have choking episodes when you cannot  Get enough air, gasping for air ?              n  Do you usually cough when you lie down into  The bed, or when you just lie down to rest ?                          n  Do you usually cough after meals or eating?         n  Do you cough when (or after) you bend over?    n  GERD SCORE  0  Kouffman Reflux v Neurogenic Cough Differentiator Neurogenic  Do you more-or-less cough all day long? n  Does change of temperature make you cough? n  Does laughing or chuckling cause you to cough? y  Do fumes (perfume, automobile fumes, burned  Toast, etc.,) cause you to cough ?      y  Does speaking, singing, or talking on the phone cause you to cough   ?               n  Neurogenic/Airway score 2     Review of Systems  Constitutional: Negative for fever and unexpected weight change.  HENT: Negative for ear pain, nosebleeds, congestion, sore  throat, rhinorrhea, sneezing, trouble swallowing, dental problem, postnasal drip and sinus pressure.  Eyes: Negative for redness and itching.  Respiratory: Negative for cough, chest tightness, shortness of breath and wheezing.   Cardiovascular: Negative for palpitations and leg swelling.  Gastrointestinal: Negative for nausea and vomiting.  Genitourinary: Negative for dysuria.  Musculoskeletal: Negative for joint swelling.  Skin: Negative for rash.  Neurological: Negative for headaches.  Hematological: Does not bruise/bleed easily.  Psychiatric/Behavioral: Negative for dysphoric mood. The patient is not nervous/anxious.        Objective:   Physical Exam  Vitals reviewed. Constitutional: She is oriented to person, place, and time. She appears well-developed and well-nourished. No distress.  Body mass index is 40.3 kg/(m^2).   HENT:  Head: Normocephalic and atraumatic.  Right Ear: External ear normal.  Left Ear: External ear normal.  Mouth/Throat: Oropharynx is clear and moist. No oropharyngeal exudate.  Eyes: Conjunctivae and EOM are normal. Pupils are equal, round, and reactive to light. Right eye exhibits no discharge. Left eye exhibits no discharge. No scleral icterus.  Neck: Normal range of motion. Neck supple. No JVD present. No tracheal deviation present. No thyromegaly present.  Cardiovascular: Normal rate, regular rhythm, normal heart sounds and intact distal pulses.  Exam reveals no gallop and no friction rub.   No murmur heard. Pulmonary/Chest: Effort normal and breath sounds normal. No respiratory distress. She has no wheezes. She has no rales. She exhibits no tenderness.  Abdominal: Soft. Bowel sounds are normal. She exhibits no distension and no mass. There is no tenderness. There is no rebound and no guarding.  Musculoskeletal: Normal range of motion. She exhibits no edema and no tenderness.  Lymphadenopathy:    She has no cervical adenopathy.  Neurological: She is  alert and oriented to person, place, and time. She has normal reflexes. No cranial nerve deficit. She exhibits normal muscle tone. Coordination normal.  Skin: Skin is warm and dry. No rash noted. She is not diaphoretic. No erythema. No pallor.  Psychiatric: She has a normal mood and affect. Her behavior is normal. Judgment and thought content normal.        Assessment & Plan:

## 2013-06-02 NOTE — Patient Instructions (Addendum)
#  Lung nodule  - 6 mm see in May 2014  - Do followup CT chest in Jan 2015  #Coronary artery calcification  - see DR Jacinto Halim for this; referred you  #Shortness of breath - Rule out angina equivalent; so referred to Dr. Jeanella Cara cardiology  - further evaluation after seeing Dr Jacinto Halim - if cardiac evaluation negative will do bike pulmonary stress test  #Cough  - do cough score now with my CMA  - continue spiriva  - stop fish oil - start OTC prilosec 20mg  daily on empty stomach  #Mild emphysema  - continue spiriva daily  #Snoring and nocturnal desaturations  - I will talk to Dr Craige Cotta and see about setting sleep study  #Followup  - 6 to 8 weeks with cough score at fu

## 2013-06-02 NOTE — Telephone Encounter (Signed)
I saw her for acute visit.  I saw you had requested for sleep evaluation, but this was not scheduled.  I did not know if she had sleep consult already scheduled.  If not, then please enter this.

## 2013-06-02 NOTE — Telephone Encounter (Signed)
VS  Between my office visit and your office vist there was a phone note where I wanted her to see you due to noctural desaturations. I think that got missed in commuinication to you. She is BMI 40, snores and desats at niught. Can you order sleep study and see her back for fu?  Dr. Kalman Shan, M.D., [facility]C.P Pulmonary and Critical Care Medicine Staff Physician [facility] Pager: [phone], If no answer or between  15:00h - 7:00h: call [phone]  06/02/2013 2:33 PM

## 2013-06-03 ENCOUNTER — Telehealth: Payer: Self-pay | Admitting: Internal Medicine

## 2013-06-03 NOTE — Telephone Encounter (Signed)
Spoke with the pt and scheduled her for sleep eval with VS on 07/01/13 at 4 pm Pt aware to arrive 15 min prior to fill out sleep form

## 2013-06-03 NOTE — Telephone Encounter (Signed)
[id number] x1 

## 2013-06-03 NOTE — Telephone Encounter (Signed)
Pt returned call. Anne Juarez °

## 2013-06-03 NOTE — Assessment & Plan Note (Signed)
Currently pulmonary function test is normal and CT scan only shows mild emphysema but she has coronary artery calcifications Rule out angina equivalent due to presence of coronary artery calcifications. So I have referred her to Dr. Jeanella Cara. If his cardiac evaluation is negative then I will do a bike pulmonary stress test. In the absence of angina equivalent I would suspect obesity, deconditioning and diastolic dysfunction as common and likely etiologies for dyspnea

## 2013-06-03 NOTE — Assessment & Plan Note (Signed)
 #  Mild emphysema  - continue spiriva daily

## 2013-06-03 NOTE — Telephone Encounter (Signed)
Pt returned call from triage. [phone]. Anne Juarez

## 2013-06-03 NOTE — Assessment & Plan Note (Signed)
This is associated with snoring. Her PFT is normal especially diffusion. CT scan chest only shows mild emphysema. I suspect nocturnal hypoxemia is due to sleep apnea. I discussed the Dr Coralyn Helling and we felt the best option is for her to do a sleep medicine consultation with him. I will communicate this with her

## 2013-06-03 NOTE — Assessment & Plan Note (Signed)
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

## 2013-06-03 NOTE — Assessment & Plan Note (Signed)
#  Lung nodule  - 6 mm see in May 2014  - Do followup CT chest in Jan 2015

## 2013-06-03 NOTE — Telephone Encounter (Signed)
Please let her know that I discussed with Dr Coralyn Helling and that we agreed that she should see him for a formal sleep apnea evaluation first in his office. Most recently she saw him and is confusion as to why she saw him but this was for acute respiratory issues.

## 2013-07-01 ENCOUNTER — Ambulatory Visit (INDEPENDENT_AMBULATORY_CARE_PROVIDER_SITE_OTHER): Payer: Medicare Other | Admitting: Pulmonary Disease

## 2013-07-01 ENCOUNTER — Encounter: Payer: Self-pay | Admitting: Pulmonary Disease

## 2013-07-01 VITALS — BP 142/80 | HR 91 | Temp 97.7°F | Ht 62.0 in | Wt 221.4 lb

## 2013-07-01 DIAGNOSIS — R0609 Other forms of dyspnea: Secondary | ICD-10-CM

## 2013-07-01 DIAGNOSIS — R0989 Other specified symptoms and signs involving the circulatory and respiratory systems: Secondary | ICD-10-CM

## 2013-07-01 DIAGNOSIS — G4733 Obstructive sleep apnea (adult) (pediatric): Secondary | ICD-10-CM

## 2013-07-01 DIAGNOSIS — R0683 Snoring: Secondary | ICD-10-CM

## 2013-07-01 HISTORY — DX: Obstructive sleep apnea (adult) (pediatric): G47.33

## 2013-07-01 NOTE — Progress Notes (Signed)
Chief Complaint  Patient presents with  . Sleep Consult    pt reports she is not getting enough o2 while she is sleeping, states not getting enough rest very tired thorughout the day EPWORTH SCORE:10    History of Present Illness: Anne Juarez is a 72 y.o. female for evaluation of sleep problems.  She is followed by Dr. Marchelle Gearing for emphysema.  She had recently overnight oximetry which showed oxygenation desaturation pattern suggestive of sleep apnea.  She reports snoring, sleep disruption, and witnessed apnea.  She is referred for further evaluation of sleep apnea.  She goes to sleep at 11 pm.  She falls asleep in 15 minutes.  She wakes up 2 times to use the bathroom.  She gets out of bed at 630.  She feels tired in the morning.  She denies morning headache.  She does not use anything to help her fall sleep or stay awake.  She can't sleep on her back >> feels like she can't breath.  Her mouth gets dry when a sleep.  She is tired all the time.  She feels likes she can't focus, and has trouble with her memory.  She denies sleep walking, sleep talking, bruxism, or nightmares.  There is no history of restless legs.  She denies sleep hallucinations, sleep paralysis, or cataplexy.  The Epworth score is 10 out of 24.  Tests: PFT 04/23/13 >> FEV1 2.0 (109%), FEV1% 79, TLC 4.48 (109%), DLCO 95%  ONO with RA 04/27/13 >> Test time 9 hrs 28 min.  Baseline SpO2 97%, low SpO2 54%.  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  Anne Juarez  has past surgical history that includes Laparoscopic tubal ligation; Tonsillectomy; Pilonidal cyst excision; Colonoscopy; and Polypectomy.  Prior to Admission medications   Medication Sig Start Date End Date Taking? Authorizing Provider   albuterol (PROAIR HFA) 108 (90 BASE) MCG/ACT inhaler Inhale 2 puffs into the lungs every 6 (six) hours as needed for wheezing.   Yes Historical Provider, MD  amLODipine (NORVASC) 5 MG tablet Take 5 mg by mouth daily.   Yes Historical Provider, MD  atorvastatin (LIPITOR) 40 MG tablet Take 40 mg by mouth daily.   Yes Historical Provider, MD  Cholecalciferol (VITAMIN D-3) 1000 UNITS CAPS Take by mouth daily.   Yes Historical Provider, MD  cyanocobalamin 1000 MCG tablet Take 100 mcg by mouth daily.   Yes Historical Provider, MD  cyclobenzaprine (FLEXERIL) 10 MG tablet Take 10 mg by mouth 3 (three) times daily as needed.    Yes Historical Provider, MD  DULoxetine (CYMBALTA) 30 MG capsule Take 30 mg by mouth daily.   Yes Historical Provider, MD  fish oil-omega-3 fatty acids 1000 MG capsule Take 2 g by mouth daily.   Yes Historical Provider, MD  MAGNESIUM PO Take by mouth. Magnesium with zinc-Take 3 daily   Yes Historical Provider, MD  Milk Thistle 1000 MG CAPS Take by mouth daily.   Yes Historical Provider, MD  Multiple Vitamins-Minerals (CENTRUM SILVER PO) Take by mouth daily.   Yes Historical Provider, MD  Potassium Gluconate 2 MEQ TABS Take by mouth daily.   Yes Historical Provider, MD  pyridoxine (B-6) 100 MG tablet Take 100 mg by mouth daily.   Yes Historical Provider, MD  telmisartan-hydrochlorothiazide (MICARDIS HCT) 80-25  MG per tablet Take 1 tablet by mouth daily.   Yes Historical Provider, MD  tiotropium (SPIRIVA) 18 MCG inhalation capsule Place 1 capsule (18 mcg total) into inhaler and inhale daily. 06/02/13  Yes Kalman Shan, MD  Turmeric Curcumin 500 MG CAPS Take by mouth. Take 2 daily   Yes Historical Provider, MD  vitamin C (ASCORBIC ACID) 500 MG tablet Take 500 mg by mouth daily.   Yes Historical Provider, MD  vitamin E 400 UNIT capsule Take 400 Units by mouth daily.   Yes Historical Provider, MD    No Known Allergies  Her family history includes Colon cancer in her sister; Liver  cancer in her brother; and Stomach cancer in her paternal aunt.  There is no history of Esophageal cancer and Rectal cancer.  She  reports that she quit smoking about 11 years ago. Her smoking use included Cigarettes. She has a 112.5 pack-year smoking history. She has never used smokeless tobacco. She reports that she does not drink alcohol or use illicit drugs.  Review of Systems  Constitutional: Negative for fever and unexpected weight change.  HENT: Negative for ear pain, nosebleeds, congestion, sore throat, rhinorrhea, sneezing, trouble swallowing, dental problem, postnasal drip and sinus pressure.   Eyes: Negative for redness and itching.  Respiratory: Positive for cough and shortness of breath. Negative for chest tightness and wheezing.   Cardiovascular: Negative for palpitations and leg swelling.  Gastrointestinal: Negative for nausea and vomiting.  Genitourinary: Negative for dysuria.  Musculoskeletal: Negative for joint swelling.  Skin: Positive for rash.  Neurological: Negative for headaches.  Hematological: Does not bruise/bleed easily.  Psychiatric/Behavioral: Negative for dysphoric mood. The patient is not nervous/anxious.    Physical Exam:  General - No distress ENT - No sinus tenderness, no oral exudate, MP 2, no LAN, no thyromegaly, TM clear, pupils equal/reactive Cardiac - s1s2 regular, no murmur, pulses symmetric Chest - No wheeze/rales/dullness, good air entry, normal respiratory excursion Back - No focal tenderness Abd - Soft, non-tender, no organomegaly, + bowel sounds Ext - No edema Neuro - Normal strength, cranial nerves intact Skin - No rashes Psych - Normal mood, and behavior

## 2013-07-01 NOTE — Patient Instructions (Signed)
Will arrange for sleep study Will call to arrange for follow up after sleep study reviewed 

## 2013-07-01 NOTE — Progress Notes (Deleted)
  Subjective:    Patient ID: Anne Juarez, female    DOB: [birth year], 72 y.o.   MRN: [id number]  HPI    Review of Systems  Constitutional: Negative for fever and unexpected weight change.  HENT: Negative for ear pain, nosebleeds, congestion, sore throat, rhinorrhea, sneezing, trouble swallowing, dental problem, postnasal drip and sinus pressure.   Eyes: Negative for redness and itching.  Respiratory: Positive for cough and shortness of breath. Negative for chest tightness and wheezing.   Cardiovascular: Negative for palpitations and leg swelling.  Gastrointestinal: Negative for nausea and vomiting.  Genitourinary: Negative for dysuria.  Musculoskeletal: Negative for joint swelling.  Skin: Positive for rash.  Neurological: Negative for headaches.  Hematological: Does not bruise/bleed easily.  Psychiatric/Behavioral: Negative for dysphoric mood. The patient is not nervous/anxious.        Objective:   Physical Exam        Assessment & Plan:

## 2013-07-01 NOTE — Assessment & Plan Note (Signed)
She has snoring, sleep disruption, witnessed apnea, and daytime sleepiness.  She has history of hypertension, depression and emphysema.  Recent overnight oximetry showed significant oxygen desaturation with pattern suggestive of sleep apnea.  I am concerned she could have obstructive sleep apnea.  We discussed how sleep apnea can affect various health problems including risks for hypertension, cardiovascular disease, and diabetes.  We also discussed how sleep disruption can increase risks for accident, such as while driving.  Weight loss as a means of improving sleep apnea was also reviewed.  Additional treatment options discussed were CPAP therapy, oral appliance, and surgical intervention.  To further assess will arrange for in lab sleep study.

## 2013-07-07 DIAGNOSIS — R0609 Other forms of dyspnea: Secondary | ICD-10-CM | POA: Diagnosis not present

## 2013-07-07 DIAGNOSIS — I2510 Atherosclerotic heart disease of native coronary artery without angina pectoris: Secondary | ICD-10-CM | POA: Diagnosis not present

## 2013-07-07 DIAGNOSIS — R0989 Other specified symptoms and signs involving the circulatory and respiratory systems: Secondary | ICD-10-CM | POA: Diagnosis not present

## 2013-07-07 DIAGNOSIS — E662 Morbid (severe) obesity with alveolar hypoventilation: Secondary | ICD-10-CM | POA: Diagnosis not present

## 2013-07-07 DIAGNOSIS — R079 Chest pain, unspecified: Secondary | ICD-10-CM | POA: Diagnosis not present

## 2013-07-14 ENCOUNTER — Ambulatory Visit: Payer: Medicare Other | Admitting: Internal Medicine

## 2013-07-16 ENCOUNTER — Telehealth: Payer: Self-pay | Admitting: Internal Medicine

## 2013-07-16 NOTE — Telephone Encounter (Signed)
Ok fine  Dr. Kalman Shan, M.D., [facility].C.P Pulmonary and Critical Care Medicine Staff Physician [facility] [facility] Pager: [phone], If no answer or between  15:00h - 7:00h: call [phone]  07/16/2013 10:19 AM

## 2013-07-16 NOTE — Telephone Encounter (Signed)
Will forward to Dr. Marchelle Gearing as an Lorain Childes

## 2013-08-04 ENCOUNTER — Ambulatory Visit (HOSPITAL_BASED_OUTPATIENT_CLINIC_OR_DEPARTMENT_OTHER): Payer: Medicare Other | Attending: Pulmonary Disease

## 2013-08-04 DIAGNOSIS — R0683 Snoring: Secondary | ICD-10-CM

## 2013-08-04 DIAGNOSIS — G4733 Obstructive sleep apnea (adult) (pediatric): Secondary | ICD-10-CM | POA: Insufficient documentation

## 2013-08-08 ENCOUNTER — Telehealth: Payer: Self-pay | Admitting: Pulmonary Disease

## 2013-08-08 ENCOUNTER — Encounter: Payer: Self-pay | Admitting: Pulmonary Disease

## 2013-08-08 DIAGNOSIS — G4733 Obstructive sleep apnea (adult) (pediatric): Secondary | ICD-10-CM | POA: Diagnosis not present

## 2013-08-08 NOTE — Telephone Encounter (Signed)
PSG 08/04/13 >> AHI 59.6, SpO2 low 83%, PLMI 0.  Will have my nurse arrange for ROV to review results in more detail.

## 2013-08-08 NOTE — Procedures (Signed)
NAMEZENOBIA, KUENNEN NO.:  [id number]  MEDICAL RECORD NO.:  [id number]          PATIENT TYPE:  OUT  LOCATION:  SLEEP CENTER                 FACILITY:  [facility]  PHYSICIAN:  Coralyn Helling, MD        DATE OF BIRTH:  November 14, 1941  DATE OF STUDY:  08/04/2013                           NOCTURNAL POLYSOMNOGRAM  REFERRING PHYSICIAN:  Coralyn Helling, MD  FACILITY:  [facility]  INDICATION FOR STUDY:  Ms. Greis is a 72 year old female, who has a history of hypertension and depression.  She also has snoring, sleep disruption, and daytime sleepiness.  She is referred to sleep lab for evaluation of hypersomnia with obstructive sleep apnea.  Height is 5 feet 2 inches, weight is 221 pounds.  BMI is 40.  Neck size is 16.5 inches.  Medications are reviewed and in her chart.  EPWORTH SLEEPINESS SCORE:  3.  SLEEP ARCHITECTURE:  Total recording time was 367 minutes.  Total sleep time was 140 minutes.  Sleep efficiency was 78%.  Sleep latency was 131 minutes.  The study was notable for lack of slow-wave sleep and REM sleep.  She slept in both the supine and nonsupine positions.  She had difficulty with sleep initiation and sleep maintenance due to respiratory events.  RESPIRATORY DATA:  The average respiratory rate was 11.  Moderate snoring was noted by the technician.  The overall apnea/hypopnea index was 59.6.  The events were exclusively obstructive in nature.  OXYGEN DATA:  The baseline oxygenation was 98%.  The oxygen saturation nadir was 83%.  The patient spent a total of 50 minutes with an oxygen saturation below 90%.  The study was conducted without the use of supplemental oxygen.  CARDIAC DATA:  The average heart rate was 86 and the rhythm strip showed sinus rhythm with occasional PVCs.  MOVEMENT-PARASOMNIA:  The periodic limb movement index was 0 and the patient had 1 restroom trip.  IMPRESSIONS-RECOMMENDATIONS:  This study shows evidence for  severe obstructive sleep apnea with an apnea/hypopnea index of 59.6.  An oxygen saturation nadir of 83%.  In addition to weight reduction, I would recommend the patient be tried on either CPAP therapy, oral appliance, or surgical intervention.     Coralyn Helling, MD Diplomat, American Board of Sleep Medicine    VS/MEDQ  D:  08/08/2013 15:20:44  T:  08/08/2013 17:30:55  Job:  [id number]

## 2013-08-11 NOTE — Telephone Encounter (Signed)
Appointment has been made for 09/26/13 at 1:45p

## 2013-08-11 NOTE — Telephone Encounter (Signed)
lmtcb

## 2013-08-11 NOTE — Telephone Encounter (Signed)
Pt returned call. Anne Juarez  

## 2013-09-15 DIAGNOSIS — Z0000 Encounter for general adult medical examination without abnormal findings: Secondary | ICD-10-CM | POA: Diagnosis not present

## 2013-09-15 DIAGNOSIS — E7800 Pure hypercholesterolemia, unspecified: Secondary | ICD-10-CM | POA: Diagnosis not present

## 2013-09-15 DIAGNOSIS — Z1331 Encounter for screening for depression: Secondary | ICD-10-CM | POA: Diagnosis not present

## 2013-09-15 DIAGNOSIS — R7301 Impaired fasting glucose: Secondary | ICD-10-CM | POA: Diagnosis not present

## 2013-09-15 DIAGNOSIS — I10 Essential (primary) hypertension: Secondary | ICD-10-CM | POA: Diagnosis not present

## 2013-09-15 DIAGNOSIS — R0602 Shortness of breath: Secondary | ICD-10-CM | POA: Diagnosis not present

## 2013-09-22 DIAGNOSIS — M47814 Spondylosis without myelopathy or radiculopathy, thoracic region: Secondary | ICD-10-CM | POA: Diagnosis not present

## 2013-09-22 DIAGNOSIS — I10 Essential (primary) hypertension: Secondary | ICD-10-CM | POA: Diagnosis not present

## 2013-09-22 DIAGNOSIS — R7301 Impaired fasting glucose: Secondary | ICD-10-CM | POA: Diagnosis not present

## 2013-09-22 DIAGNOSIS — E7800 Pure hypercholesterolemia, unspecified: Secondary | ICD-10-CM | POA: Diagnosis not present

## 2013-09-22 DIAGNOSIS — M549 Dorsalgia, unspecified: Secondary | ICD-10-CM | POA: Diagnosis not present

## 2013-09-26 ENCOUNTER — Encounter: Payer: Self-pay | Admitting: Pulmonary Disease

## 2013-09-26 ENCOUNTER — Ambulatory Visit (INDEPENDENT_AMBULATORY_CARE_PROVIDER_SITE_OTHER): Payer: Medicare Other | Admitting: Pulmonary Disease

## 2013-09-26 VITALS — BP 160/96 | HR 80 | Ht 62.0 in | Wt 225.0 lb

## 2013-09-26 DIAGNOSIS — G4733 Obstructive sleep apnea (adult) (pediatric): Secondary | ICD-10-CM | POA: Diagnosis not present

## 2013-09-26 NOTE — Progress Notes (Signed)
Chief Complaint  Patient presents with  . Follow-up    Review sleep study results.    History of Present Illness: Anne Juarez is a 72 y.o. female with OSA.  She is here to review her sleep study.  This showed severe sleep apnea.   TESTS: PFT 04/23/13 >> FEV1 2.0 (109%), FEV1% 79, TLC 4.48 (109%), DLCO 95%  ONO with RA 04/27/13 >> Test time 9 hrs 28 min.  Baseline SpO2 97%, low SpO2 54%.  Spent 166 min with SpO2 < 88% CT chest 05/14/13 >> mild centrilobular emphysema, coronary calcifications, 6 mm RML nodule PSG 08/04/13 >> AHI 59.6, SpO2 low 83%, PLMI 0.   Anne Juarez  has a past medical history of Hyperlipidemia; Hypertension; Anxiety; Depression; Osteoarthritis (arthritis due to wear and tear of joints); and OSA (obstructive sleep apnea) (07/01/2013).  Anne Juarez  has past surgical history that includes Laparoscopic tubal ligation; Tonsillectomy; Pilonidal cyst excision; Colonoscopy; and Polypectomy.  Prior to Admission medications   Medication Sig Start Date End Date Taking? Authorizing Provider  albuterol (PROAIR HFA) 108 (90 BASE) MCG/ACT inhaler Inhale 2 puffs into the lungs every 6 (six) hours as needed for wheezing.   Yes Historical Provider, MD  amLODipine (NORVASC) 5 MG tablet Take 5 mg by mouth daily.   Yes Historical Provider, MD  atorvastatin (LIPITOR) 40 MG tablet Take 40 mg by mouth daily.   Yes Historical Provider, MD  Cholecalciferol (VITAMIN D-3) 1000 UNITS CAPS Take by mouth daily.   Yes Historical Provider, MD  cyanocobalamin 1000 MCG tablet Take 100 mcg by mouth daily.   Yes Historical Provider, MD  cyclobenzaprine (FLEXERIL) 10 MG tablet Take 10 mg by mouth 3 (three) times daily as needed.    Yes Historical Provider, MD  DULoxetine (CYMBALTA) 30 MG capsule Take 30 mg by mouth daily.   Yes Historical Provider, MD  fish oil-omega-3 fatty acids 1000 MG capsule Take 2 g by mouth daily.   Yes Historical Provider, MD  MAGNESIUM PO Take by mouth. Magnesium with zinc-Take 3  daily   Yes Historical Provider, MD  Milk Thistle 1000 MG CAPS Take by mouth daily.   Yes Historical Provider, MD  Multiple Vitamins-Minerals (CENTRUM SILVER PO) Take by mouth daily.   Yes Historical Provider, MD  Potassium Gluconate 2 MEQ TABS Take by mouth daily.   Yes Historical Provider, MD  pyridoxine (B-6) 100 MG tablet Take 100 mg by mouth daily.   Yes Historical Provider, MD  telmisartan-hydrochlorothiazide (MICARDIS HCT) 80-25 MG per tablet Take 1 tablet by mouth daily.   Yes Historical Provider, MD  tiotropium (SPIRIVA) 18 MCG inhalation capsule Place 1 capsule (18 mcg total) into inhaler and inhale daily. 06/02/13  Yes Kalman Shan, MD  Turmeric Curcumin 500 MG CAPS Take by mouth. Take 2 daily   Yes Historical Provider, MD  vitamin C (ASCORBIC ACID) 500 MG tablet Take 500 mg by mouth daily.   Yes Historical Provider, MD  vitamin E 400 UNIT capsule Take 400 Units by mouth daily.   Yes Historical Provider, MD    No Known Allergies   Physical Exam:  General - No distress ENT - No sinus tenderness, no oral exudate, no LAN Cardiac - s1s2 regular, no murmur Chest - No wheeze/rales/dullness Back - No focal tenderness Abd - Soft, non-tender Ext - No edema Neuro - Normal strength Skin - No rashes Psych - normal mood, and behavior   Assessment/Plan:  Coralyn Helling, MD [place] [facility]/Critical Care/Sleep Pager:  [phone]

## 2013-09-26 NOTE — Assessment & Plan Note (Signed)
She has severe sleep apnea.  She has history of emphysema.  I have reviewed the recent sleep study results with the patient.  We discussed how sleep apnea can affect various health problems including risks for hypertension, cardiovascular disease, and diabetes.  We also discussed how sleep disruption can increase risks for accident, such as while driving.  Weight loss as a means of improving sleep apnea was also reviewed.  Additional treatment options discussed were CPAP therapy, oral appliance, and surgical intervention.  Will arrange for in lab titration study.

## 2013-09-26 NOTE — Patient Instructions (Signed)
Will arrange for CPAP titration study  Will call to schedule follow up after sleep study reviewed 

## 2013-09-29 DIAGNOSIS — R7301 Impaired fasting glucose: Secondary | ICD-10-CM | POA: Diagnosis not present

## 2013-09-29 DIAGNOSIS — I10 Essential (primary) hypertension: Secondary | ICD-10-CM | POA: Diagnosis not present

## 2013-09-29 DIAGNOSIS — M549 Dorsalgia, unspecified: Secondary | ICD-10-CM | POA: Diagnosis not present

## 2013-10-22 ENCOUNTER — Ambulatory Visit (HOSPITAL_BASED_OUTPATIENT_CLINIC_OR_DEPARTMENT_OTHER): Payer: Medicare Other | Attending: Pulmonary Disease | Admitting: Radiology

## 2013-10-22 VITALS — Ht 62.0 in | Wt 225.0 lb

## 2013-10-22 DIAGNOSIS — I10 Essential (primary) hypertension: Secondary | ICD-10-CM | POA: Insufficient documentation

## 2013-10-22 DIAGNOSIS — J4489 Other specified chronic obstructive pulmonary disease: Secondary | ICD-10-CM | POA: Diagnosis not present

## 2013-10-22 DIAGNOSIS — J449 Chronic obstructive pulmonary disease, unspecified: Secondary | ICD-10-CM | POA: Insufficient documentation

## 2013-10-22 DIAGNOSIS — G4733 Obstructive sleep apnea (adult) (pediatric): Secondary | ICD-10-CM | POA: Diagnosis not present

## 2013-10-22 DIAGNOSIS — G4761 Periodic limb movement disorder: Secondary | ICD-10-CM | POA: Insufficient documentation

## 2013-11-18 ENCOUNTER — Telehealth: Payer: Self-pay | Admitting: Pulmonary Disease

## 2013-11-18 DIAGNOSIS — G4736 Sleep related hypoventilation in conditions classified elsewhere: Secondary | ICD-10-CM

## 2013-11-18 DIAGNOSIS — IMO0002 Reserved for concepts with insufficient information to code with codable children: Secondary | ICD-10-CM | POA: Insufficient documentation

## 2013-11-18 DIAGNOSIS — G4733 Obstructive sleep apnea (adult) (pediatric): Secondary | ICD-10-CM | POA: Diagnosis not present

## 2013-11-18 NOTE — Telephone Encounter (Signed)
BiPAP 10/22/13 >> BiPAP 12/8 cm H2O >> AHI 1.3, +R.  PLMI 42.  Will have my nurse inform pt that titration study reviewed, and she did well with BiPAP.  Options are 1) to arrange for BiPAP set up now, and then have ROV in 2 months, or 2) have ROV prior to BiPAP set up.  If pt is agreeable to set up BiPAP now, then arrange for BiPAP 12/8 cm H2O with heated humidity and mask of choice.  Please dx codes of OSA, COPD with emphysema, and sleep related hypoventilation.

## 2013-11-18 NOTE — Telephone Encounter (Signed)
atc na x1 

## 2013-11-21 NOTE — Procedures (Signed)
Anne Juarez, MACDOWELL NO.:  [id number]  MEDICAL RECORD NO.:  [id number]          PATIENT TYPE:  OUT  LOCATION:  SLEEP CENTER                 [facility]ACILITY:  [facility]  PHYSICIAN:  Coralyn Helling, MD        DATE OF BIRTH:  1940/12/27  DATE OF STUDY:  10/22/2013                           NOCTURNAL POLYSOMNOGRAM  REFERRING PHYSICIAN:  Coralyn Helling, MD  FACILITY:  [facility].  INDICATION FOR STUDY:  Ms. Terriquez is a 72 year old female who has a history of hypertension and COPD.  She was found to have severe obstructive sleep apnea during sleep study from August 04, 2013, which showed an apnea-hypopnea index 59.6, and oxygen saturation nadir of 83%. She is referred back to the Sleep Lab for a titration study.  Height is 5 feet 2 inches, weight is 225 pounds.  BMI is 41, neck size is 16.5 inches.  EPWORTH SLEEPINESS SCORE:  4.  MEDICATIONS:  Reviewed in her chart.  SLEEP ARCHITECTURE:  Total recording time was 396 minutes.  Total sleep time was 264 minutes.  Sleep efficiency was 67%.  Sleep latency was 78 minutes.  REM latency was 178 minutes.  The study was notable for lack of slow-wave sleep and she slept exclusively in the nonsupine position.  RESPIRATORY DATA:  The average respiratory rate was 16.  The patient was started on BiPAP of 8/4, and increased to BiPAP of 12/8 cm water.  With BiPAP at 12/8 cm of water, the apnea/hypopnea index was reduced to 1.3. At this pressure setting, she was observed in REM sleep.  OXYGEN DATA:  The baseline oxygenation was 95%.  The oxygen saturation nadir was 84%.  The study was conducted without the use of supplemental oxygen.  The patient had good control of her oxygenation with BiPAP of 12/8 cm water.  CARDIAC DATA:  The average heart rate was 77 and rhythm strip showed sinus rhythm with occasional PVCs.  MOVEMENT-PARASOMNIA:  The periodic limb movement index was 42 and the patient had no restroom  trips.  IMPRESSIONS-RECOMMENDATIONS:  This was a successful BiPAP titration study.  She did well with BiPAP of 12/8 cm water.  She was fitted with a large-sized ResMed AirFit F10 mask.  She had a significant increase in her periodic limb movement index and clinical correlation would be necessary to determine the significance of this.  In addition to weight reduction, I would recommend that the patient be started on BiPAP of 12/8 cm of water and monitored for her clinical response.     Coralyn Helling, MD Diplomat, American Board of Sleep Medicine    VS/MEDQ  D:  11/20/2013 12:18:17  T:  11/21/2013 01:45:15  Job:  [id number]

## 2013-11-24 NOTE — Telephone Encounter (Signed)
Pt is aware of her options. At this time she chooses to come in and speak with VS about BiPAP treatment. ROV as been scheduled for 12/30/13 at 9:00am.

## 2013-12-22 ENCOUNTER — Ambulatory Visit (INDEPENDENT_AMBULATORY_CARE_PROVIDER_SITE_OTHER)
Admission: RE | Admit: 2013-12-22 | Discharge: 2013-12-22 | Disposition: A | Discharge status: Home / Self Care | Payer: Medicare Other | Source: Ambulatory Visit | Attending: Internal Medicine | Admitting: Internal Medicine

## 2013-12-22 DIAGNOSIS — I2510 Atherosclerotic heart disease of native coronary artery without angina pectoris: Secondary | ICD-10-CM | POA: Diagnosis not present

## 2013-12-22 DIAGNOSIS — I2584 Coronary atherosclerosis due to calcified coronary lesion: Secondary | ICD-10-CM | POA: Diagnosis not present

## 2013-12-22 DIAGNOSIS — R911 Solitary pulmonary nodule: Secondary | ICD-10-CM | POA: Diagnosis not present

## 2013-12-22 DIAGNOSIS — J984 Other disorders of lung: Secondary | ICD-10-CM | POA: Diagnosis not present

## 2013-12-29 ENCOUNTER — Telehealth: Payer: Self-pay | Admitting: Internal Medicine

## 2013-12-29 NOTE — Telephone Encounter (Signed)
  VS  You are seeing her for OSA 12/30/13. She has not seen me for fu and also I had wanted her to see cards and she canceled that. Could you please tell her that CT chest Jan 2015 shows stable lung nodule 6mm. Next needed in 12 months If she wants to followup with you for her pulmonary issues that is fine with me. IF she is okay following with me let me know and I will place order for fu CT chest  Thanks  Dr. Kalman ShanMurali Amarrion Pastorino, M.D., [facility]P Pulmonary and Critical Care Medicine Staff Physician [facility] [place] [facility] and Critical Care Pager: [phone], If no answer or between  15:00h - 7:00h: call [phone]  12/29/2013 4:29 PM

## 2013-12-30 ENCOUNTER — Ambulatory Visit (INDEPENDENT_AMBULATORY_CARE_PROVIDER_SITE_OTHER): Payer: Medicare Other | Admitting: Pulmonary Disease

## 2013-12-30 ENCOUNTER — Encounter: Payer: Self-pay | Admitting: Pulmonary Disease

## 2013-12-30 ENCOUNTER — Telehealth: Payer: Self-pay | Admitting: Internal Medicine

## 2013-12-30 VITALS — BP 120/88 | HR 75 | Temp 97.9°F | Ht 62.0 in | Wt 226.0 lb

## 2013-12-30 DIAGNOSIS — J438 Other emphysema: Secondary | ICD-10-CM | POA: Diagnosis not present

## 2013-12-30 DIAGNOSIS — G4736 Sleep related hypoventilation in conditions classified elsewhere: Secondary | ICD-10-CM

## 2013-12-30 DIAGNOSIS — G4739 Other sleep apnea: Secondary | ICD-10-CM

## 2013-12-30 DIAGNOSIS — R911 Solitary pulmonary nodule: Secondary | ICD-10-CM

## 2013-12-30 DIAGNOSIS — J439 Emphysema, unspecified: Secondary | ICD-10-CM

## 2013-12-30 DIAGNOSIS — IMO0002 Reserved for concepts with insufficient information to code with codable children: Secondary | ICD-10-CM

## 2013-12-30 DIAGNOSIS — G4733 Obstructive sleep apnea (adult) (pediatric): Secondary | ICD-10-CM

## 2013-12-30 NOTE — Telephone Encounter (Signed)
CT chest reviewed with pt on 12/30/13.  Advised her to f/u with Dr. Marchelle Gearingamaswamy to discuss plan for f/u.

## 2013-12-30 NOTE — Assessment & Plan Note (Signed)
Reviewed her BiPAP titration study results.  Explained how sleep apnea is affecting her health and daytime function.  She is agreeable to proceed with BiPAP set up.

## 2013-12-30 NOTE — Progress Notes (Signed)
Chief Complaint  Patient presents with  . Sleep Apnea    Here today to discuss BiPAP treatment.     History of Present Illness: Anne Juarez is a 73 y.o. female with OSA.  She is here to review BiPAP titration study.  She continues to have trouble with sleep, snoring, waking up frequently at night, and daytime sleepiness.  She has occasional cough with clear sputum.  She denies wheeze, fever, sweats, chest pain, or hemoptysis.  She had f/u CT chest 12/22/13, but has not heard about results yet.  She requested I review this today.  TESTS: PFT 04/23/13 >> FEV1 2.0 (109%), FEV1% 79, TLC 4.48 (109%), DLCO 95%  ONO with RA 04/27/13 >> Test time 9 hrs 28 min.  Baseline SpO2 97%, low SpO2 54%.  Spent 166 min with SpO2 < 88% CT chest 05/14/13 >> mild centrilobular emphysema, coronary calcifications, 6 mm RML nodule PSG 08/04/13 >> AHI 59.6, SpO2 low 83%, PLMI 0. BiPAP 10/22/13 >> BiPAP 12/8 cm H2O >> AHI 1.3, +R. PLMI 42.   Anne GrainRuby Goldner  has a past medical history of Hyperlipidemia; Hypertension; Anxiety; Depression; Osteoarthritis (arthritis due to wear and tear of joints); and OSA (obstructive sleep apnea) (07/01/2013).  Anne GrainRuby Catarino  has past surgical history that includes Laparoscopic tubal ligation; Tonsillectomy; Pilonidal cyst excision; Colonoscopy; and Polypectomy.  Prior to Admission medications   Medication Sig Start Date End Date Taking? Authorizing Provider  albuterol (PROAIR HFA) 108 (90 BASE) MCG/ACT inhaler Inhale 2 puffs into the lungs every 6 (six) hours as needed for wheezing.   Yes Historical Provider, MD  amLODipine (NORVASC) 5 MG tablet Take 5 mg by mouth daily.   Yes Historical Provider, MD  atorvastatin (LIPITOR) 40 MG tablet Take 40 mg by mouth daily.   Yes Historical Provider, MD  Cholecalciferol (VITAMIN D-3) 1000 UNITS CAPS Take by mouth daily.   Yes Historical Provider, MD  cyanocobalamin 1000 MCG tablet Take 100 mcg by mouth daily.   Yes Historical Provider, MD   cyclobenzaprine (FLEXERIL) 10 MG tablet Take 10 mg by mouth 3 (three) times daily as needed.    Yes Historical Provider, MD  DULoxetine (CYMBALTA) 30 MG capsule Take 30 mg by mouth daily.   Yes Historical Provider, MD  fish oil-omega-3 fatty acids 1000 MG capsule Take 2 g by mouth daily.   Yes Historical Provider, MD  MAGNESIUM PO Take by mouth. Magnesium with zinc-Take 3 daily   Yes Historical Provider, MD  Milk Thistle 1000 MG CAPS Take by mouth daily.   Yes Historical Provider, MD  Multiple Vitamins-Minerals (CENTRUM SILVER PO) Take by mouth daily.   Yes Historical Provider, MD  Potassium Gluconate 2 MEQ TABS Take by mouth daily.   Yes Historical Provider, MD  pyridoxine (B-6) 100 MG tablet Take 100 mg by mouth daily.   Yes Historical Provider, MD  telmisartan-hydrochlorothiazide (MICARDIS HCT) 80-25 MG per tablet Take 1 tablet by mouth daily.   Yes Historical Provider, MD  tiotropium (SPIRIVA) 18 MCG inhalation capsule Place 1 capsule (18 mcg total) into inhaler and inhale daily. 06/02/13  Yes Kalman ShanMurali Ramaswamy, MD  Turmeric Curcumin 500 MG CAPS Take by mouth. Take 2 daily   Yes Historical Provider, MD  vitamin C (ASCORBIC ACID) 500 MG tablet Take 500 mg by mouth daily.   Yes Historical Provider, MD  vitamin E 400 UNIT capsule Take 400 Units by mouth daily.   Yes Historical Provider, MD    No Known Allergies   Physical Exam:  General - No distress ENT - No sinus tenderness, no oral exudate, no LAN Cardiac - s1s2 regular, no murmur Chest - No wheeze/rales/dullness Back - No focal tenderness Abd - Soft, non-tender Ext - No edema Neuro - Normal strength Skin - No rashes Psych - normal mood, and behavior  Ct Chest Wo Contrast  12/22/2013   CLINICAL DATA:  Followup right middle lobe nodule.  Cough.  EXAM: CT CHEST WITHOUT CONTRAST  TECHNIQUE: Multidetector CT imaging of the chest was performed following the standard protocol without IV contrast.  COMPARISON:  04/1913.  FINDINGS: No  pathologically enlarged mediastinal or axillary lymph nodes. Hilar regions are difficult to definitively evaluate without IV contrast. Atherosclerotic calcification of the arterial vasculature, including at least 2 vessel involvement of the coronary arteries. Heart size normal. No pericardial effusion. Pre pericardiac and juxta diaphragmatic lymph nodes are subcentimeter in short axis size. Esophagus is partially dilated which can be seen with dysmotility.  Minimal biapical pleural parenchymal scarring. Mild centrilobular emphysema. A 5 mm (4 x 6 mm) subpleural nodule in the inferior right middle lobe (image 40) is unchanged. Mild peribronchovascular airspace disease in the medial aspect of the left upper lobe (images 26-28), new. No pleural fluid. Airway is unremarkable.  Incidental imaging of the upper abdomen shows low attenuation throughout the visualized portion of the liver, with areas of peripheral sparing. 9 mm low-attenuation lesion along the upper pole the left kidney is likely unchanged. No worrisome lytic or sclerotic lesions. Degenerative changes are seen in the spine.  IMPRESSION: 1. Resolved or stable scattered pulmonary nodular densities, measuring up to 6 mm in the right middle lobe. Given the patient's increased risk for bronchogenic carcinoma, additional followup could be performed in 12 months to ensure continued stability, as clinically indicated. This recommendation follows the consensus statement: Guidelines for Management of Small Pulmonary Nodules Detected on CT Scans: A Statement from the Fleischner Society as published in Radiology 2005; 237:395-400. 2. Mild peribronchovascular airspace disease in the medial aspect of the left upper lobe appears new from 05/05/2013 and may be due to resolving pneumonia, in this patient with a current history of a cough. 3. Hepatic steatosis.   Electronically Signed   By: Leanna Battles M.D.   On: 12/22/2013 11:40    Assessment/Plan:  Coralyn Helling,  MD [place] [facility]/Critical Care/Sleep Pager:  [phone]

## 2013-12-30 NOTE — Patient Instructions (Signed)
Will arrange for BiPAP set up Follow up in 2 months 

## 2013-12-30 NOTE — Assessment & Plan Note (Signed)
Briefly reviewed her recent CT chest results (per pt request).  She does not have clinical evidence for pneumonia.  Will defer to Dr. Marchelle Gearingamaswamy to discuss CT chest results with pt in more detail.

## 2013-12-30 NOTE — Telephone Encounter (Signed)
Giver her first avail to see me  Thanks  Dr. Kalman ShanMurali Taryll Reichenberger, M.D., [facility]F.C.C.P Pulmonary and Critical Care Medicine Staff Physician [place]  Pulmonary and Critical Care Pager: [phone], If no answer or between  15:00h - 7:00h: call [phone]  12/30/2013 12:24 PM

## 2013-12-30 NOTE — Assessment & Plan Note (Signed)
Related to OSA, obesity, and COPD.  Will proceed with BiPAP set up.

## 2013-12-30 NOTE — Telephone Encounter (Signed)
[email or web address].Avary Pitsenbarger, CMA  

## 2013-12-31 NOTE — Telephone Encounter (Signed)
Pt returned call & asked to speak w/ Victorino DikeJennifer prior to making an appointment.  Antionette FairyHolly D Pryor

## 2013-12-31 NOTE — Telephone Encounter (Signed)
Spoke with pt. She scheduled an appt to see MR. Nothing further needed

## 2014-01-29 ENCOUNTER — Ambulatory Visit (INDEPENDENT_AMBULATORY_CARE_PROVIDER_SITE_OTHER): Payer: Medicare Other | Admitting: Internal Medicine

## 2014-01-29 ENCOUNTER — Encounter: Payer: Self-pay | Admitting: Internal Medicine

## 2014-01-29 VITALS — BP 122/70 | HR 86 | Temp 97.6°F | Ht 62.0 in | Wt 231.4 lb

## 2014-01-29 DIAGNOSIS — J438 Other emphysema: Secondary | ICD-10-CM | POA: Diagnosis not present

## 2014-01-29 DIAGNOSIS — I2510 Atherosclerotic heart disease of native coronary artery without angina pectoris: Secondary | ICD-10-CM

## 2014-01-29 DIAGNOSIS — R911 Solitary pulmonary nodule: Secondary | ICD-10-CM

## 2014-01-29 DIAGNOSIS — I2584 Coronary atherosclerosis due to calcified coronary lesion: Secondary | ICD-10-CM

## 2014-01-29 DIAGNOSIS — J439 Emphysema, unspecified: Secondary | ICD-10-CM

## 2014-01-29 DIAGNOSIS — G4733 Obstructive sleep apnea (adult) (pediatric): Secondary | ICD-10-CM

## 2014-01-29 NOTE — Progress Notes (Signed)
Subjective:    Patient ID: Anne Juarez, female    DOB: [birth year], 73 y.o.   MRN: [id number]  HPI     OV 01/29/2014  Chief Complaint  Patient presents with  . Follow-up    Pt states breathing is the same since last visit. Decreased chest congestion since starting CPAP. SOB with activity and slightly SOB at rest. Denies CP and tightness. Wheezing intermittently.    Followup for  - Multifactorial dyspnea related to obesity, deconditioning, possibly coronary artery calcification, and mild emphysema seen on CT scan of the chest (: PFT 04/23/13 >> FEV1 2.0 (109%), FEV1% 79, TLC 4.48 (109%), DLCO 95%_    This is stable to slightly improved. At this point in time she does not want evaluation for coronary artery calcification due to family issues with both her children having advanced to cancers. She continues on Spiriva is helping her.  - Chronic cough: This is resolved  - 6 mm right middle lobe lung nodule: This is stable but in May 2014 and January 2015.  - Sleep apnea: She has seen Dr.Sood and is on overnight noninvasive ventilation and has significantly improved. She feels that this has made the biggest difference in her life     Review of Systems  Constitutional: Negative for fever and unexpected weight change.  HENT: Positive for nosebleeds. Negative for congestion, dental problem, ear pain, postnasal drip, rhinorrhea, sinus pressure, sneezing, sore throat and trouble swallowing.   Eyes: Positive for redness. Negative for itching.  Respiratory: Positive for shortness of breath and wheezing. Negative for cough and chest tightness.   Cardiovascular: Negative for palpitations and leg swelling.  Gastrointestinal: Negative for nausea and vomiting.  Genitourinary: Negative for dysuria.  Musculoskeletal: Negative for joint swelling.  Skin: Negative for rash.  Neurological: Negative for headaches.  Hematological: Does not bruise/bleed easily.  Psychiatric/Behavioral: Negative for  dysphoric mood. The patient is not nervous/anxious.        Objective:   Physical Exam  Vitals reviewed. Constitutional: She is oriented to person, place, and time. She appears well-developed and well-nourished. No distress.  Body mass index is 42.31 kg/(m^2).   HENT:  Head: Normocephalic and atraumatic.  Right Ear: External ear normal.  Left Ear: External ear normal.  Mouth/Throat: Oropharynx is clear and moist. No oropharyngeal exudate.  Eyes: Conjunctivae and EOM are normal. Pupils are equal, round, and reactive to light. Right eye exhibits no discharge. Left eye exhibits no discharge. No scleral icterus.  Neck: Normal range of motion. Neck supple. No JVD present. No tracheal deviation present. No thyromegaly present.  Cardiovascular: Normal rate, regular rhythm, normal heart sounds and intact distal pulses.  Exam reveals no gallop and no friction rub.   No murmur heard. Pulmonary/Chest: Effort normal and breath sounds normal. No respiratory distress. She has no wheezes. She has no rales. She exhibits no tenderness.  Abdominal: Soft. Bowel sounds are normal. She exhibits no distension and no mass. There is no tenderness. There is no rebound and no guarding.  Musculoskeletal: Normal range of motion. She exhibits no edema and no tenderness.  Lymphadenopathy:    She has no cervical adenopathy.  Neurological: She is alert and oriented to person, place, and time. She has normal reflexes. No cranial nerve deficit. She exhibits normal muscle tone. Coordination normal.  Skin: Skin is warm and dry. No rash noted. She is not diaphoretic. No erythema. No pallor.  Psychiatric: Judgment normal.  Flat affect  Assessment & Plan:

## 2014-01-29 NOTE — Patient Instructions (Addendum)
#  Lung nodule 6mm Right Middle Lobe  - stable May 2014 through Jan 2015  - repeat CT chest Jan 2016  #Mild emphysema  - continue spiriva  #CHronic cough  - glad is resolved  #Shortness of breath and Coronary artery calcification  - given your family issues we will hold off on cardiology workup   #Sleep Apnea  - per DR Craige CottaSood, glda you are better  #FOllowup  9 months or sooner if needed

## 2014-01-31 DIAGNOSIS — I2510 Atherosclerotic heart disease of native coronary artery without angina pectoris: Secondary | ICD-10-CM | POA: Insufficient documentation

## 2014-01-31 DIAGNOSIS — I2584 Coronary atherosclerosis due to calcified coronary lesion: Secondary | ICD-10-CM

## 2014-01-31 NOTE — Assessment & Plan Note (Signed)
#  Mild emphysema  - continue spiriva  #CHronic cough  - glad is resolved  #FOllowup  9 months or sooner if needed

## 2014-01-31 NOTE — Assessment & Plan Note (Signed)
#  Sleep Apnea  - per DR Craige CottaSood, glda you are better

## 2014-01-31 NOTE — Assessment & Plan Note (Signed)
#  Lung nodule 6mm Right Middle Lobe  - stable May 2014 through Jan 2015  - repeat CT chest Jan 2016

## 2014-01-31 NOTE — Assessment & Plan Note (Signed)
#  Shortness of breath and Coronary artery calcification  - given your family issues we will hold off on cardiology workup

## 2014-02-11 ENCOUNTER — Encounter: Payer: Self-pay | Admitting: Pulmonary Disease

## 2014-02-12 ENCOUNTER — Ambulatory Visit: Payer: Medicare Other | Admitting: Pulmonary Disease

## 2014-02-13 ENCOUNTER — Ambulatory Visit (INDEPENDENT_AMBULATORY_CARE_PROVIDER_SITE_OTHER)
Admission: RE | Admit: 2014-02-13 | Discharge: 2014-02-13 | Disposition: A | Discharge status: Home / Self Care | Payer: Medicare Other | Source: Ambulatory Visit | Attending: Pulmonary Disease | Admitting: Pulmonary Disease

## 2014-02-13 ENCOUNTER — Encounter: Payer: Self-pay | Admitting: Pulmonary Disease

## 2014-02-13 ENCOUNTER — Telehealth: Payer: Self-pay | Admitting: Pulmonary Disease

## 2014-02-13 ENCOUNTER — Other Ambulatory Visit (INDEPENDENT_AMBULATORY_CARE_PROVIDER_SITE_OTHER): Payer: Medicare Other

## 2014-02-13 ENCOUNTER — Ambulatory Visit (INDEPENDENT_AMBULATORY_CARE_PROVIDER_SITE_OTHER): Payer: Medicare Other | Admitting: Pulmonary Disease

## 2014-02-13 VITALS — BP 132/72 | HR 74 | Ht 62.0 in | Wt 233.0 lb

## 2014-02-13 DIAGNOSIS — I2510 Atherosclerotic heart disease of native coronary artery without angina pectoris: Secondary | ICD-10-CM | POA: Diagnosis not present

## 2014-02-13 DIAGNOSIS — R0609 Other forms of dyspnea: Secondary | ICD-10-CM

## 2014-02-13 DIAGNOSIS — R0989 Other specified symptoms and signs involving the circulatory and respiratory systems: Secondary | ICD-10-CM | POA: Diagnosis not present

## 2014-02-13 DIAGNOSIS — I2584 Coronary atherosclerosis due to calcified coronary lesion: Secondary | ICD-10-CM

## 2014-02-13 DIAGNOSIS — G4733 Obstructive sleep apnea (adult) (pediatric): Secondary | ICD-10-CM | POA: Diagnosis not present

## 2014-02-13 DIAGNOSIS — R600 Localized edema: Secondary | ICD-10-CM | POA: Insufficient documentation

## 2014-02-13 DIAGNOSIS — R0600 Dyspnea, unspecified: Secondary | ICD-10-CM

## 2014-02-13 DIAGNOSIS — I509 Heart failure, unspecified: Secondary | ICD-10-CM | POA: Diagnosis not present

## 2014-02-13 DIAGNOSIS — G4736 Sleep related hypoventilation in conditions classified elsewhere: Secondary | ICD-10-CM

## 2014-02-13 DIAGNOSIS — R609 Edema, unspecified: Secondary | ICD-10-CM

## 2014-02-13 DIAGNOSIS — R0602 Shortness of breath: Secondary | ICD-10-CM | POA: Diagnosis not present

## 2014-02-13 DIAGNOSIS — G4739 Other sleep apnea: Secondary | ICD-10-CM

## 2014-02-13 DIAGNOSIS — IMO0002 Reserved for concepts with insufficient information to code with codable children: Secondary | ICD-10-CM

## 2014-02-13 LAB — COMPREHENSIVE METABOLIC PANEL
ALT: 55 U/L — ABNORMAL HIGH (ref 0–35)
AST: 41 U/L — ABNORMAL HIGH (ref 0–37)
Albumin: 3.9 g/dL (ref 3.5–5.2)
Alkaline Phosphatase: 87 U/L (ref 39–117)
BUN: 12 mg/dL (ref 6–23)
CO2: 31 mEq/L (ref 19–32)
Calcium: 9.3 mg/dL (ref 8.4–10.5)
Chloride: 98 mEq/L (ref 96–112)
Creatinine, Ser: 0.7 mg/dL (ref 0.4–1.2)
GFR: 81.72 mL/min (ref >60.00)
Glucose, Bld: 112 mg/dL — ABNORMAL HIGH (ref 70–99)
Potassium: 3.3 mEq/L — ABNORMAL LOW (ref 3.5–5.1)
Sodium: 135 mEq/L (ref 135–145)
Total Bilirubin: 0.6 mg/dL (ref 0.3–1.2)
Total Protein: 7.4 g/dL (ref 6.0–8.3)

## 2014-02-13 LAB — CBC WITH DIFFERENTIAL/PLATELET
Basophils Absolute: 0 10*3/uL (ref 0.0–0.1)
Basophils Relative: 0.3 % (ref 0.0–3.0)
Eosinophils Absolute: 0.2 10*3/uL (ref 0.0–0.7)
Eosinophils Relative: 2.9 % (ref 0.0–5.0)
HCT: 41.5 % (ref 36.0–46.0)
Hemoglobin: 13.6 g/dL (ref 12.0–15.0)
Lymphocytes Relative: 34.9 % (ref 12.0–46.0)
Lymphs Abs: 2.8 10*3/uL (ref 0.7–4.0)
MCHC: 32.8 g/dL (ref 30.0–36.0)
MCV: 94 fl (ref 78.0–100.0)
Monocytes Absolute: 0.7 10*3/uL (ref 0.1–1.0)
Monocytes Relative: 9.1 % (ref 3.0–12.0)
Neutro Abs: 4.2 10*3/uL (ref 1.4–7.7)
Neutrophils Relative %: 52.8 % (ref 43.0–77.0)
Platelets: 229 10*3/uL (ref 150.0–400.0)
RBC: 4.41 Mil/uL (ref 3.87–5.11)
RDW: 14.5 % (ref 11.5–14.6)
WBC: 8 10*3/uL (ref 4.5–10.5)

## 2014-02-13 LAB — BRAIN NATRIURETIC PEPTIDE: Pro B Natriuretic peptide (BNP): 37 pg/mL (ref 0.0–100.0)

## 2014-02-13 MED ORDER — POTASSIUM CHLORIDE ER 10 MEQ PO TBCR: EXTENDED_RELEASE_TABLET | ORAL | Status: DC

## 2014-02-13 MED ORDER — FUROSEMIDE 20 MG PO TABS: ORAL_TABLET | ORAL | Status: DC

## 2014-02-13 NOTE — Assessment & Plan Note (Signed)
She is doing well since transition to BiPAP.  She is compliant and reports benefit.  She is to continue BiPAP 12/8 cm H2O.

## 2014-02-13 NOTE — Progress Notes (Signed)
Chief Complaint  Patient presents with  . Sleep Apnea    Currently using BiPAP machine every night. Does not feel well rested after use. Denies problems with machine, mask or pressure.    History of Present Illness: Anne Juarez is a 73 y.o. 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  She has been doing well with BiPAP.  This has helped her sleep and breathing while asleep.  She was doing much better until the past two weeks.  Since then she has noticed more trouble with her breathing.  She feels like she is choking/smothering if she lays flat.  She is not having cough, wheeze, or sputum.  She has also noticed more leg swelling, and this goes up to her knees b/l.  She has gained about 7 lbs over the past two weeks.  She denies chest pain/pressure, or palpitations.  She has not noticed difficulty passing urine.  She has tried using spiriva, but this has not helped.  TESTS: PFT 04/23/13 >> FEV1 2.0 (109%), FEV1% 79, TLC 4.48 (109%), DLCO 95%  ONO with RA 04/27/13 >> Test time 9 hrs 28 min.  Baseline SpO2 97%, low SpO2 54%.  Spent 166 min with SpO2 < 88% CT chest 05/14/13 >> mild centrilobular emphysema, coronary calcifications, 6 mm RML nodule PSG 08/04/13 >> AHI 59.6, SpO2 low 83%, PLMI 0. BiPAP 10/22/13 >> BiPAP 12/8 cm H2O >> AHI 1.3, +R. PLMI 42. BiPAP 01/06/14 to 02/04/14 >> Used on 29 of 30 nights with average 6 hrs 50 min.  Average AHI 2.1 with BiPAP 12/8 cm H2O.  Anne Juarez  has a past medical history of Hyperlipidemia; Hypertension; Anxiety; Depression; Osteoarthritis (arthritis due to wear and tear of joints); and OSA (obstructive sleep apnea) (07/01/2013).  Anne Juarez  has past surgical history that includes Laparoscopic tubal ligation; Tonsillectomy; Pilonidal cyst excision; Colonoscopy; and Polypectomy.  Prior to Admission medications   Medication Sig Start Date End Date Taking? Authorizing Provider  albuterol (PROAIR HFA) 108 (90 BASE) MCG/ACT inhaler Inhale 2 puffs  into the lungs every 6 (six) hours as needed for wheezing.   Yes Historical Provider, MD  amLODipine (NORVASC) 5 MG tablet Take 5 mg by mouth daily.   Yes Historical Provider, MD  atorvastatin (LIPITOR) 40 MG tablet Take 40 mg by mouth daily.   Yes Historical Provider, MD  Cholecalciferol (VITAMIN D-3) 1000 UNITS CAPS Take by mouth daily.   Yes Historical Provider, MD  cyanocobalamin 1000 MCG tablet Take 100 mcg by mouth daily.   Yes Historical Provider, MD  cyclobenzaprine (FLEXERIL) 10 MG tablet Take 10 mg by mouth 3 (three) times daily as needed.    Yes Historical Provider, MD  DULoxetine (CYMBALTA) 30 MG capsule Take 30 mg by mouth daily.   Yes Historical Provider, MD  fish oil-omega-3 fatty acids 1000 MG capsule Take 2 g by mouth daily.   Yes Historical Provider, MD  MAGNESIUM PO Take by mouth. Magnesium with zinc-Take 3 daily   Yes Historical Provider, MD  Milk Thistle 1000 MG CAPS Take by mouth daily.   Yes Historical Provider, MD  Multiple Vitamins-Minerals (CENTRUM SILVER PO) Take by mouth daily.   Yes Historical Provider, MD  Potassium Gluconate 2 MEQ TABS Take by mouth daily.   Yes Historical Provider, MD  pyridoxine (B-6) 100 MG tablet Take 100 mg by mouth daily.   Yes Historical Provider, MD  telmisartan-hydrochlorothiazide (MICARDIS HCT) 80-25 MG per tablet Take 1 tablet by mouth daily.   Yes Historical  Provider, MD  tiotropium (SPIRIVA) 18 MCG inhalation capsule Place 1 capsule (18 mcg total) into inhaler and inhale daily. 06/02/13  Yes Kalman Shan, MD  Turmeric Curcumin 500 MG CAPS Take by mouth. Take 2 daily   Yes Historical Provider, MD  vitamin C (ASCORBIC ACID) 500 MG tablet Take 500 mg by mouth daily.   Yes Historical Provider, MD  vitamin E 400 UNIT capsule Take 400 Units by mouth daily.   Yes Historical Provider, MD    No Known Allergies   Physical Exam:  General - No distress ENT - No sinus tenderness, no oral exudate, no LAN Cardiac - s1s2 regular, no  murmur Chest - decreased breath sounds, faint basilar rales Back - No focal tenderness Abd - Soft, non-tender Ext - 2+ pitting edema up to knees b/l, no erythema/tenderness Neuro - Normal strength Skin - No rashes Psych - normal mood, and behavior  Dg Chest 2 View  02/13/2014   CLINICAL DATA:  Severe shortness of breath for 2-3 weeks, former smoking history  EXAM: CHEST  2 VIEW  COMPARISON:  CT chest of 12/22/2013 and chest x-ray of 03/17/2013  FINDINGS: No active infiltrate or effusion is seen. Mediastinal contours are stable. The heart is within upper limits normal for age and stable. There are diffuse degenerative changes throughout the thoracic spine.  IMPRESSION: Stable chest x-ray.  No active lung disease.   Electronically Signed   By: Dwyane Dee M.D.   On: 02/13/2014 11:27   CMP     Component Value Date/Time   NA 135 02/13/2014 1104   K 3.3* 02/13/2014 1104   CL 98 02/13/2014 1104   CO2 31 02/13/2014 1104   GLUCOSE 112* 02/13/2014 1104   BUN 12 02/13/2014 1104   CREATININE 0.7 02/13/2014 1104   CALCIUM 9.3 02/13/2014 1104   PROT 7.4 02/13/2014 1104   ALBUMIN 3.9 02/13/2014 1104   AST 41* 02/13/2014 1104   ALT 55* 02/13/2014 1104   ALKPHOS 87 02/13/2014 1104   BILITOT 0.6 02/13/2014 1104    CBC    Component Value Date/Time   WBC 8.0 02/13/2014 1104   RBC 4.41 02/13/2014 1104   HGB 13.6 02/13/2014 1104   HCT 41.5 02/13/2014 1104   PLT 229.0 02/13/2014 1104   MCV 94.0 02/13/2014 1104   MCHC 32.8 02/13/2014 1104   RDW 14.5 02/13/2014 1104   LYMPHSABS 2.8 02/13/2014 1104   MONOABS 0.7 02/13/2014 1104   EOSABS 0.2 02/13/2014 1104   BASOSABS 0.0 02/13/2014 1104    BNP    Component Value Date/Time   PROBNP 37.0 02/13/2014 1104    ECG 02/13/14 - sinus rhythm, rate 73, non-specific T wave changes  Assessment/Plan:  Coralyn Helling, MD [place] [facility]/Sleep Pager:  [phone]

## 2014-02-13 NOTE — Telephone Encounter (Signed)
Spoke w/pt. Aware I have resent rx's in for her. Nothing further needed

## 2014-02-13 NOTE — Patient Instructions (Signed)
Will arrange for overnight oxygen test with BiPAP Lasix 20 mg pill >> take one pill daily for next 3 days Potassium chloride 10 meq pill >> take one pill daily for next 3 days Follow up in 6 months

## 2014-02-13 NOTE — Assessment & Plan Note (Signed)
Will give lasix with KCL x 3 days.  Of note is that is in on amlodipine and while this could cause peripheral edema, her symptoms edema seems to have developed acutely.  Defer to PCP whether she needs to change from amlodipine.

## 2014-02-13 NOTE — Assessment & Plan Note (Addendum)
She has noticed dyspnea over the past two weeks associated with orthopnea and lower extremity edema.  CXR, labs, and ECG unremarkable/non-specific.  Will give her course of lasix x next 3 days.  She has follow up with PCP on Monday, February 16, 2014.  She may need Echo to further assess >> defer to PCP.

## 2014-02-13 NOTE — Assessment & Plan Note (Signed)
Not sure if this is contributing to her dyspnea, and leg swelling with heart failure.  Will arrange for overnight oximetry with room air and BiPAP.

## 2014-02-16 DIAGNOSIS — R609 Edema, unspecified: Secondary | ICD-10-CM | POA: Diagnosis not present

## 2014-02-16 DIAGNOSIS — I10 Essential (primary) hypertension: Secondary | ICD-10-CM | POA: Diagnosis not present

## 2014-02-18 DIAGNOSIS — R609 Edema, unspecified: Secondary | ICD-10-CM | POA: Diagnosis not present

## 2014-02-18 DIAGNOSIS — R0602 Shortness of breath: Secondary | ICD-10-CM | POA: Diagnosis not present

## 2014-02-19 DIAGNOSIS — G4733 Obstructive sleep apnea (adult) (pediatric): Secondary | ICD-10-CM | POA: Diagnosis not present

## 2014-02-23 ENCOUNTER — Telehealth: Payer: Self-pay | Admitting: Pulmonary Disease

## 2014-02-23 DIAGNOSIS — E662 Morbid (severe) obesity with alveolar hypoventilation: Secondary | ICD-10-CM

## 2014-02-23 DIAGNOSIS — G4733 Obstructive sleep apnea (adult) (pediatric): Secondary | ICD-10-CM

## 2014-02-23 NOTE — Telephone Encounter (Signed)
Pt is aware of ONO results. Orders have been placed for nocturnal oxygen and repeat ONO.

## 2014-02-23 NOTE — Telephone Encounter (Signed)
ONO with BiPAP and RA 02/19/14 >> test time 8 hrs 25 min.  Mean SpO2 92%, low SpO2 82%.  Spent 11 min with SpO2 < 89%.  Will have my nurse inform pt that her oxygen level is running low at night.  This is consistent with shallow breathing while asleep (sleep related hypoventilation) and this was discussed at her last ROV.  She will need to use 1 liter oxygen at night while using her BiPAP machine.  Will arrange for this and repeat ONO with 1 liter and BiPAP.

## 2014-02-27 DIAGNOSIS — R0602 Shortness of breath: Secondary | ICD-10-CM | POA: Diagnosis not present

## 2014-02-27 DIAGNOSIS — R079 Chest pain, unspecified: Secondary | ICD-10-CM | POA: Diagnosis not present

## 2014-02-27 DIAGNOSIS — I2510 Atherosclerotic heart disease of native coronary artery without angina pectoris: Secondary | ICD-10-CM | POA: Diagnosis not present

## 2014-03-10 ENCOUNTER — Telehealth: Payer: Self-pay | Admitting: Pulmonary Disease

## 2014-03-10 DIAGNOSIS — G4736 Sleep related hypoventilation in conditions classified elsewhere: Secondary | ICD-10-CM

## 2014-03-10 DIAGNOSIS — IMO0002 Reserved for concepts with insufficient information to code with codable children: Secondary | ICD-10-CM

## 2014-03-10 NOTE — Telephone Encounter (Signed)
Spoke with Melissa. She reports pt had ONO done and O2 was ordered. Per Melissa she did not qualify for this. Pt only was below 88% for 3 min. Medicare requires 5 min or more below 88%. Wants an order to repeat ONO Please advise Dr. Craige CottaSood thanks

## 2014-03-10 NOTE — Telephone Encounter (Signed)
Send order to repeat ONO.

## 2014-03-11 DIAGNOSIS — J438 Other emphysema: Secondary | ICD-10-CM | POA: Diagnosis not present

## 2014-03-11 DIAGNOSIS — E662 Morbid (severe) obesity with alveolar hypoventilation: Secondary | ICD-10-CM | POA: Diagnosis not present

## 2014-03-11 DIAGNOSIS — I2510 Atherosclerotic heart disease of native coronary artery without angina pectoris: Secondary | ICD-10-CM | POA: Diagnosis not present

## 2014-03-11 DIAGNOSIS — R0989 Other specified symptoms and signs involving the circulatory and respiratory systems: Secondary | ICD-10-CM | POA: Diagnosis not present

## 2014-03-11 DIAGNOSIS — R0609 Other forms of dyspnea: Secondary | ICD-10-CM | POA: Diagnosis not present

## 2014-03-11 NOTE — Telephone Encounter (Signed)
Called and spoke with pt. She is aware will repeat study. Order placed and staff message sent.

## 2014-03-26 DIAGNOSIS — G4733 Obstructive sleep apnea (adult) (pediatric): Secondary | ICD-10-CM | POA: Diagnosis not present

## 2014-03-30 ENCOUNTER — Telehealth: Payer: Self-pay | Admitting: Pulmonary Disease

## 2014-03-30 DIAGNOSIS — R7301 Impaired fasting glucose: Secondary | ICD-10-CM | POA: Diagnosis not present

## 2014-03-30 DIAGNOSIS — I10 Essential (primary) hypertension: Secondary | ICD-10-CM | POA: Diagnosis not present

## 2014-03-30 DIAGNOSIS — K861 Other chronic pancreatitis: Secondary | ICD-10-CM | POA: Diagnosis not present

## 2014-03-30 NOTE — Telephone Encounter (Signed)
ONO with BiPAP 03/26/14 >> Test tim 5 hrs 49 min.  Mean SpO2 93%, low SpO2 85%.  Spent 2 min with SpO2 < 89%.  Please inform pt that based on current oxygen test she does not need to use oxygen at night.  She is to continue BiPAP at night.

## 2014-03-30 NOTE — Telephone Encounter (Signed)
Pt is aware of ONO results. 

## 2014-03-31 ENCOUNTER — Telehealth: Payer: Self-pay | Admitting: Pulmonary Disease

## 2014-03-31 DIAGNOSIS — G4736 Sleep related hypoventilation in conditions classified elsewhere: Secondary | ICD-10-CM

## 2014-03-31 DIAGNOSIS — IMO0002 Reserved for concepts with insufficient information to code with codable children: Secondary | ICD-10-CM

## 2014-03-31 NOTE — Telephone Encounter (Signed)
Anne HellingVineet Sood, MD at 03/30/2014 3:03 PM     Status: Signed        ONO with BiPAP 03/26/14 >> Test tim 5 hrs 49 min. Mean SpO2 93%, low SpO2 85%. Spent 2 min with SpO2 < 89%.  Please inform pt that based on current oxygen test she does not need to use oxygen at night. She is to continue BiPAP at night.  --  Dr. Craige CottaSood please advise if want pt to have the O2 in home or if okay to go ahead and have this picked up. thanks

## 2014-03-31 NOTE — Telephone Encounter (Signed)
Pt is aware that we will contact [facility] to d/c her oxygen.

## 2014-03-31 NOTE — Telephone Encounter (Signed)
Can d/c home oxygen set up.

## 2014-04-06 DIAGNOSIS — E7800 Pure hypercholesterolemia, unspecified: Secondary | ICD-10-CM | POA: Diagnosis not present

## 2014-04-06 DIAGNOSIS — I10 Essential (primary) hypertension: Secondary | ICD-10-CM | POA: Diagnosis not present

## 2014-04-06 DIAGNOSIS — M1990 Unspecified osteoarthritis, unspecified site: Secondary | ICD-10-CM | POA: Diagnosis not present

## 2014-04-06 DIAGNOSIS — R7301 Impaired fasting glucose: Secondary | ICD-10-CM | POA: Diagnosis not present

## 2014-04-08 ENCOUNTER — Encounter: Payer: Self-pay | Admitting: Pulmonary Disease

## 2014-04-19 ENCOUNTER — Telehealth: Payer: Self-pay | Admitting: Pulmonary Disease

## 2014-04-19 NOTE — Telephone Encounter (Signed)
BiPAP 03/09/14 to 04/07/14 >> used on 26 of 30 nights with average 6 hrs 49 minutes.  Average AHI 1.8 with BiPAP 12/8 cm H2O.  Will have my nurse inform pt that BiPAP report looks good.  Will discuss in more detail at next ROV on 04/24/14.

## 2014-04-20 ENCOUNTER — Other Ambulatory Visit: Payer: Self-pay | Admitting: Internal Medicine

## 2014-04-20 NOTE — Telephone Encounter (Signed)
Pt is aware of results. 

## 2014-04-23 ENCOUNTER — Other Ambulatory Visit: Payer: Self-pay | Admitting: *Deleted

## 2014-04-23 MED ORDER — TIOTROPIUM BROMIDE MONOHYDRATE 18 MCG IN CAPS: ORAL_CAPSULE | RESPIRATORY_TRACT | Status: DC

## 2014-04-24 ENCOUNTER — Ambulatory Visit: Payer: Medicare Other | Admitting: Pulmonary Disease

## 2014-05-06 ENCOUNTER — Encounter: Payer: Self-pay | Admitting: Pulmonary Disease

## 2014-09-14 ENCOUNTER — Other Ambulatory Visit: Payer: Self-pay | Admitting: Internal Medicine

## 2014-10-06 DIAGNOSIS — Z0000 Encounter for general adult medical examination without abnormal findings: Secondary | ICD-10-CM | POA: Diagnosis not present

## 2014-10-06 DIAGNOSIS — E782 Mixed hyperlipidemia: Secondary | ICD-10-CM | POA: Diagnosis not present

## 2014-10-06 DIAGNOSIS — Z23 Encounter for immunization: Secondary | ICD-10-CM | POA: Diagnosis not present

## 2014-10-06 DIAGNOSIS — I10 Essential (primary) hypertension: Secondary | ICD-10-CM | POA: Diagnosis not present

## 2014-10-06 DIAGNOSIS — R7301 Impaired fasting glucose: Secondary | ICD-10-CM | POA: Diagnosis not present

## 2014-10-12 DIAGNOSIS — E782 Mixed hyperlipidemia: Secondary | ICD-10-CM | POA: Diagnosis not present

## 2014-10-12 DIAGNOSIS — I10 Essential (primary) hypertension: Secondary | ICD-10-CM | POA: Diagnosis not present

## 2014-10-12 DIAGNOSIS — Z780 Asymptomatic menopausal state: Secondary | ICD-10-CM | POA: Diagnosis not present

## 2014-10-12 DIAGNOSIS — R7301 Impaired fasting glucose: Secondary | ICD-10-CM | POA: Diagnosis not present

## 2014-10-12 DIAGNOSIS — Z23 Encounter for immunization: Secondary | ICD-10-CM | POA: Diagnosis not present

## 2014-10-12 DIAGNOSIS — K861 Other chronic pancreatitis: Secondary | ICD-10-CM | POA: Diagnosis not present

## 2014-10-15 ENCOUNTER — Encounter: Payer: Self-pay | Admitting: Pulmonary Disease

## 2014-10-15 ENCOUNTER — Ambulatory Visit (INDEPENDENT_AMBULATORY_CARE_PROVIDER_SITE_OTHER): Payer: Medicare Other | Admitting: Pulmonary Disease

## 2014-10-15 VITALS — BP 158/62 | HR 100 | Temp 97.6°F | Ht 60.0 in | Wt 237.0 lb

## 2014-10-15 DIAGNOSIS — G4733 Obstructive sleep apnea (adult) (pediatric): Secondary | ICD-10-CM | POA: Diagnosis not present

## 2014-10-15 DIAGNOSIS — I2510 Atherosclerotic heart disease of native coronary artery without angina pectoris: Secondary | ICD-10-CM | POA: Diagnosis not present

## 2014-10-15 DIAGNOSIS — R911 Solitary pulmonary nodule: Secondary | ICD-10-CM

## 2014-10-15 DIAGNOSIS — I2584 Coronary atherosclerosis due to calcified coronary lesion: Secondary | ICD-10-CM

## 2014-10-15 DIAGNOSIS — J439 Emphysema, unspecified: Secondary | ICD-10-CM

## 2014-10-15 NOTE — Progress Notes (Signed)
Chief Complaint  Patient presents with  . Follow-up    OSA-- Pt wears CPAP nightly. Pt reports the mask sweats bad and leaks. Denies porblems with pressure setting.     History of Present Illness: Anne Juarez is a 73 y.o. 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  She is followed by Dr. Marchelle Gearingamaswamy for emphysema, and lung nodule.  She has been doing well with BiPAP.  Her main issues is related to water build up in her mask.  She feels rested and can't sleep w/o BiPAP.  She has full face mask.  TESTS: PFT 04/23/13 >> FEV1 2.0 (109%), FEV1% 79, TLC 4.48 (109%), DLCO 95%  ONO with RA 04/27/13 >> Test time 9 hrs 28 min.  Baseline SpO2 97%, low SpO2 54%.  Spent 166 min with SpO2 < 88% CT chest 05/14/13 >> mild centrilobular emphysema, coronary calcifications, 6 mm RML nodule PSG 08/04/13 >> AHI 59.6, SpO2 low 83%, PLMI 0. BiPAP 10/22/13 >> BiPAP 12/8 cm H2O >> AHI 1.3, +R. PLMI 42. ONO with BiPAP 03/26/14 >> Test tim 5 hrs 49 min. Mean SpO2 93%, low SpO2 85%. Spent 2 min with SpO2 < 89%. BiPAP 07/17/14 to 10/14/14 >> used on 89 of 90 nights with average 6 hrs 45 minutes. Average AHI 1.1 with BiPAP 12/8 cm H2O.  PMHx, PSHx, Medications, Allergies, Fhx, Shx reviewed.   Physical Exam:  General - No distress ENT - No sinus tenderness, no oral exudate, no LAN Cardiac - s1s2 regular, no murmur Chest - decreased breath sounds, faint basilar rales Back - No focal tenderness Abd - Soft, non-tender Ext - no edema Neuro - Normal strength Skin - No rashes Psych - normal mood, and behavior   Assessment/Plan:  Coralyn HellingVineet Dhanya Bogle, MD [place] [facility]/Critical [facility]/Sleep Pager:  [phone]

## 2014-10-15 NOTE — Assessment & Plan Note (Signed)
She is to f/u with Dr. Marchelle Gearingamaswamy after having CT chest in January 2016.

## 2014-10-15 NOTE — Assessment & Plan Note (Signed)
She is compliant with therapy and reports benefit from BiPAP.  She is to continue BiPAP 12/8 cm H2O.  Discussed techniques to overcome rainout.

## 2014-10-15 NOTE — Assessment & Plan Note (Signed)
She is to f/u with Dr. Marchelle Gearingamaswamy.

## 2014-10-15 NOTE — Patient Instructions (Signed)
Follow up in 1 year.

## 2014-11-30 ENCOUNTER — Encounter: Payer: Medicare Other | Attending: Internal Medicine | Admitting: *Deleted

## 2014-11-30 ENCOUNTER — Encounter: Payer: Self-pay | Admitting: *Deleted

## 2014-11-30 VITALS — Ht 60.5 in | Wt 233.0 lb

## 2014-11-30 DIAGNOSIS — E119 Type 2 diabetes mellitus without complications: Secondary | ICD-10-CM | POA: Insufficient documentation

## 2014-11-30 DIAGNOSIS — Z713 Dietary counseling and surveillance: Secondary | ICD-10-CM | POA: Diagnosis not present

## 2014-11-30 NOTE — Patient Instructions (Signed)
Plan:  Aim for 3 Carb Choices per meal (45 grams) +/- 1 either way  Aim for 0-2 Carbs per snack if hungry  Include protein in moderation with your meals and snacks Consider reading food labels for Total Carbohydrate of foods Consider  increasing your activity with Arm Chair Exercises or water exercises as tolerated

## 2014-11-30 NOTE — Progress Notes (Signed)
  Medical Nutrition Therapy:  Appt start time: 1000 end time:  1100.  Assessment:  Primary concerns today: patient with elevated A1c, improved from 6.8% in April to 6.6% in October this year. She is not on any Diabetes medication at this time. She expresses concern over significant weight gain after menopause, side affects of excessive sleeping and apathy when she was on Cymbalta, and ongoing chronic pain with her back and legs. She states her husband only wants heavy, high calorie foods which she doesn't want, so she often eats sandwiches instead of cooking. She does enjoy spending time with her daughter and they sometimes travel together too.   Preferred Learning Style:   No preference indicated   Learning Readiness:   Ready  Change in progress  MEDICATIONS: see list   DIETARY INTAKE:  24-hr recall:  B ( AM): regular oatmeal with cinnamon with sugar and 1% milk  OR whole grain cereal with 1% milk, black coffee Snk ( AM): no  L ( PM): early dinner around 3 PM, banana or PNB jelly sandwich, diet soda Snk ( PM): no D ( PM): Tootsie Pop candies one at a time, 2-3 in an evening Snk ( PM): no Beverages: diet soda, coffee, ice water all day  Usual physical activity: minor housework, some walking when shopping, stays home a lot  Estimated energy needs: 1400 calories 158 g carbohydrates 105 g protein 39 g fat  Progress Towards Goal(s):  In progress.   Nutritional Diagnosis:  NB-1.1 Food and nutrition-related knowledge deficit As related to carb counting.  As evidenced by no previous education on this subject.    Intervention:  Nutrition counseling and diabetes education initiated. Discussed Carb Counting by food group as method of portion control, reading food labels, and benefits of increased activity.  Plan:  Aim for 3 Carb Choices per meal (45 grams) +/- 1 either way  Aim for 0-2 Carbs per snack if hungry  Include protein in moderation with your meals and snacks Consider  reading food labels for Total Carbohydrate of foods Consider  increasing your activity with Arm Chair Exercises or water exercises as tolerated  Teaching Method Utilized:  Visual Auditory Hands on  Handouts given during visit include: Carb Counting and Food Label handouts Meal Plan Card  [facility] [place] [facility] [place]  Arm Chair Exercise handout   Barriers to learning/adherence to lifestyle change: apparent depression and chronic pain  Demonstrated degree of understanding via:  Teach Back   Monitoring/Evaluation:  Dietary intake, exercise, reading food labels, and body weight prn.

## 2015-02-08 DIAGNOSIS — E782 Mixed hyperlipidemia: Secondary | ICD-10-CM | POA: Diagnosis not present

## 2015-02-08 DIAGNOSIS — I10 Essential (primary) hypertension: Secondary | ICD-10-CM | POA: Diagnosis not present

## 2015-02-08 DIAGNOSIS — E119 Type 2 diabetes mellitus without complications: Secondary | ICD-10-CM | POA: Diagnosis not present

## 2015-02-15 DIAGNOSIS — R7301 Impaired fasting glucose: Secondary | ICD-10-CM | POA: Diagnosis not present

## 2015-02-15 DIAGNOSIS — M797 Fibromyalgia: Secondary | ICD-10-CM | POA: Diagnosis not present

## 2015-02-15 DIAGNOSIS — E782 Mixed hyperlipidemia: Secondary | ICD-10-CM | POA: Diagnosis not present

## 2015-02-15 DIAGNOSIS — E119 Type 2 diabetes mellitus without complications: Secondary | ICD-10-CM | POA: Diagnosis not present

## 2015-02-15 DIAGNOSIS — I10 Essential (primary) hypertension: Secondary | ICD-10-CM | POA: Diagnosis not present

## 2015-02-22 DIAGNOSIS — H2513 Age-related nuclear cataract, bilateral: Secondary | ICD-10-CM | POA: Diagnosis not present

## 2015-02-22 DIAGNOSIS — H53001 Unspecified amblyopia, right eye: Secondary | ICD-10-CM | POA: Diagnosis not present

## 2015-02-22 DIAGNOSIS — H40003 Preglaucoma, unspecified, bilateral: Secondary | ICD-10-CM | POA: Diagnosis not present

## 2015-02-22 DIAGNOSIS — H524 Presbyopia: Secondary | ICD-10-CM | POA: Diagnosis not present

## 2015-04-26 IMAGING — CT CT CHEST W/O CM
2 of 3 series · 15 of 36 positions shown, 18 images · non-contrast
Comparison: [DATE].

CLINICAL DATA: Followup right middle lobe nodule.  Cough.

EXAM:
CT CHEST WITHOUT CONTRAST
TECHNIQUE: Multidetector CT imaging of the chest was performed following the
standard protocol without IV contrast.

[Series 2: chest routine with · axial · 0.71mm/px · z∈[-300,-40]mm · 12 of 62 slices shown, 15 images]
[im 5/62  mediastinal]
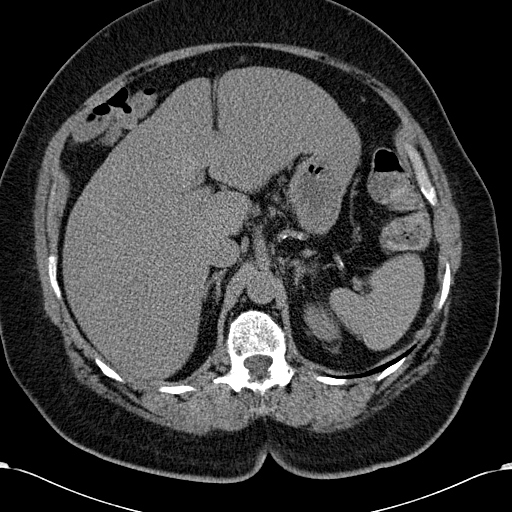
[im 5/62  lung]
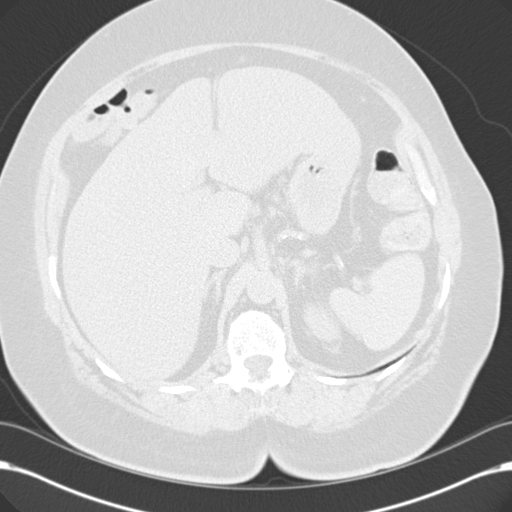
[im 10/62  lung]
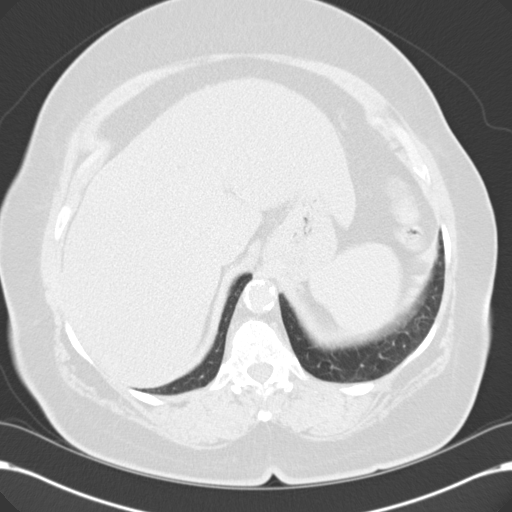
[im 14/62  lung]
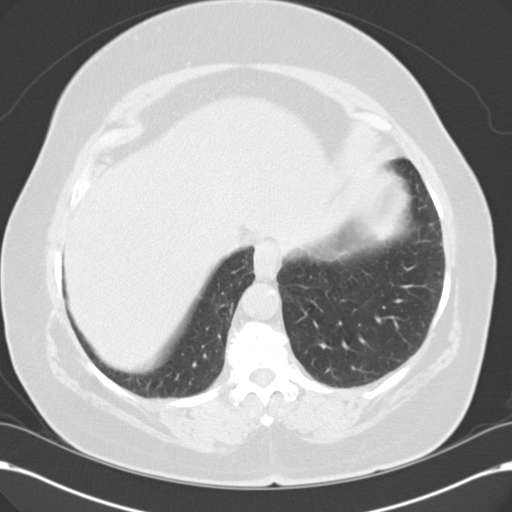
[im 19/62  lung]
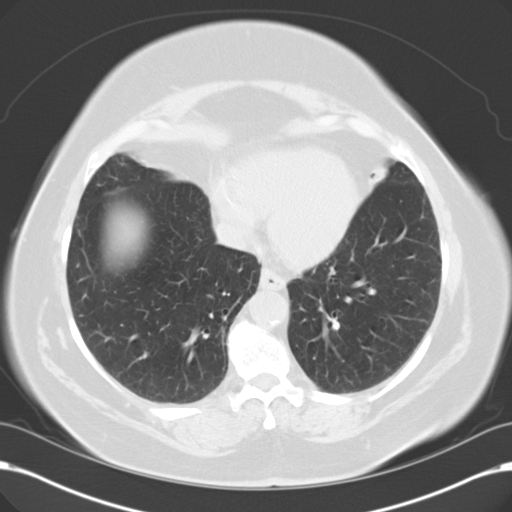
[im 23/62  mediastinal]
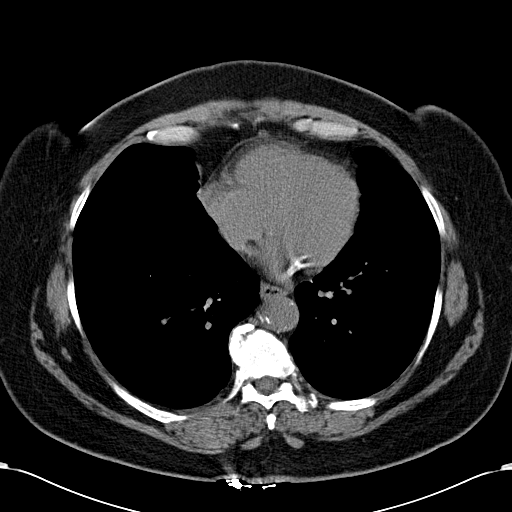
[im 23/62  lung]
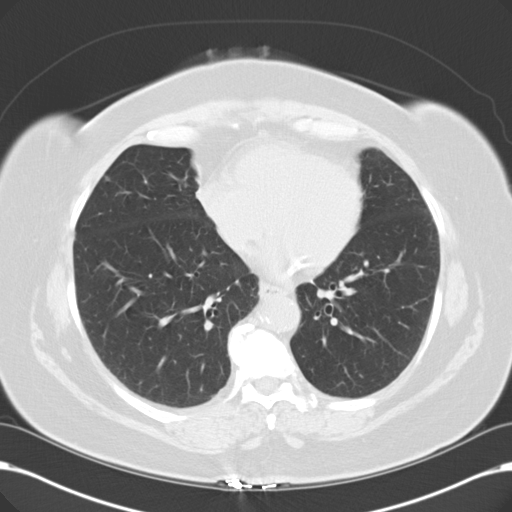
[im 28/62  lung]
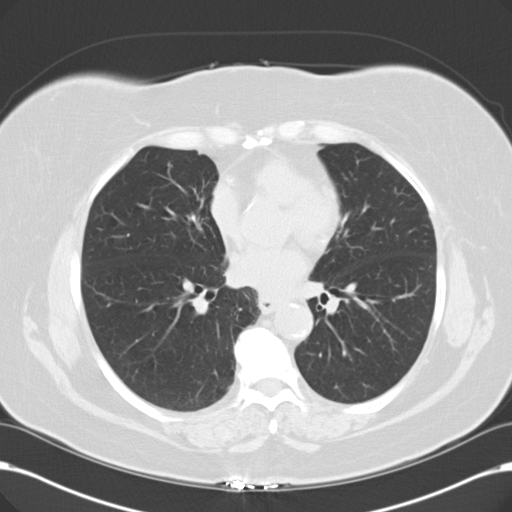
[im 34/62  lung]
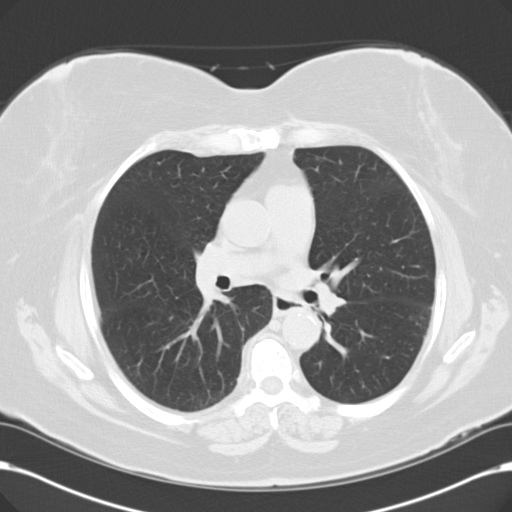
[im 39/62  lung]
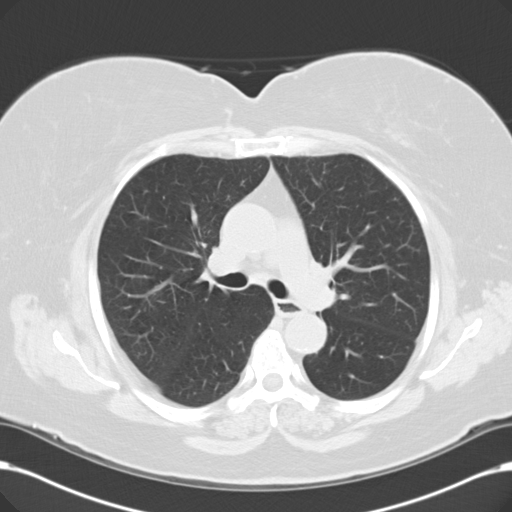
[im 43/62  mediastinal]
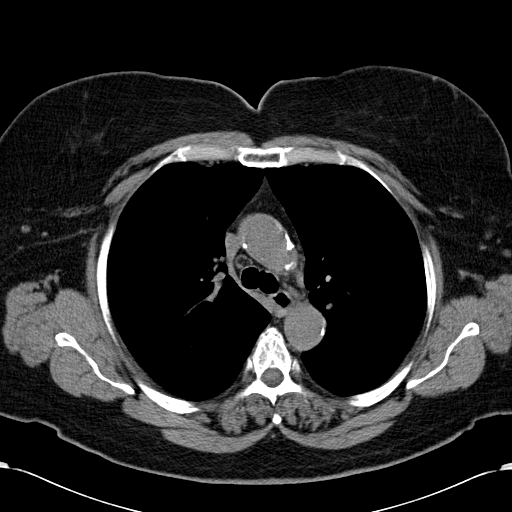
[im 43/62  lung]
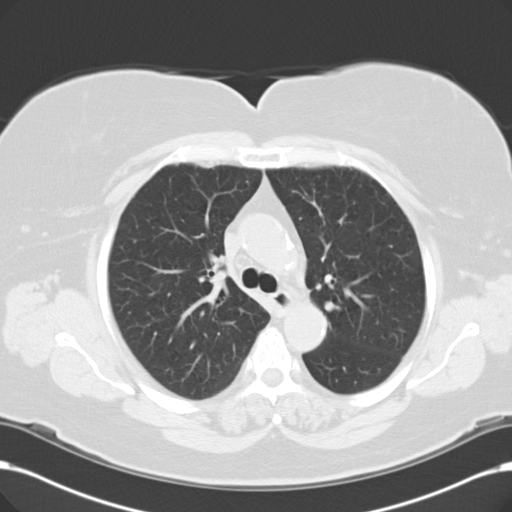
[im 48/62  lung]
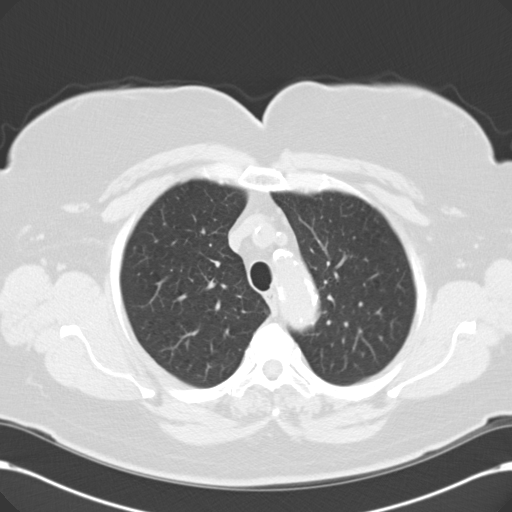
[im 52/62  lung]
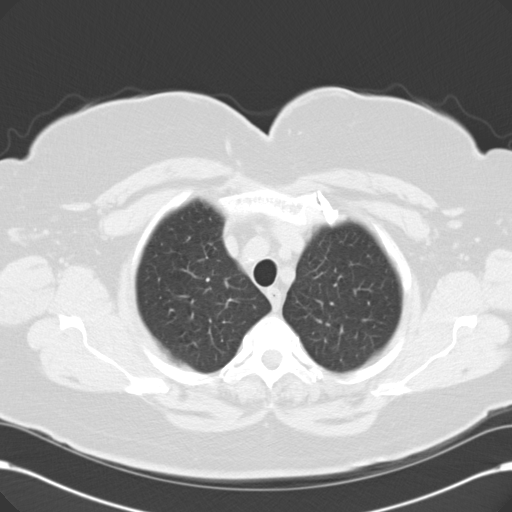
[im 57/62  lung]
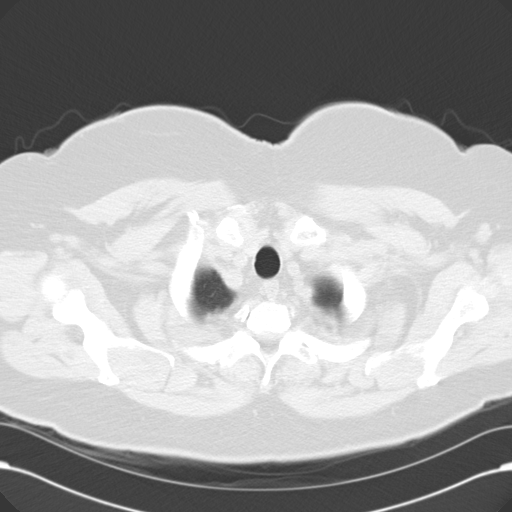

[Series 602: cor · coronal · 0.71mm/px · 3 of 112 slices shown]
[im 23/112  lung]
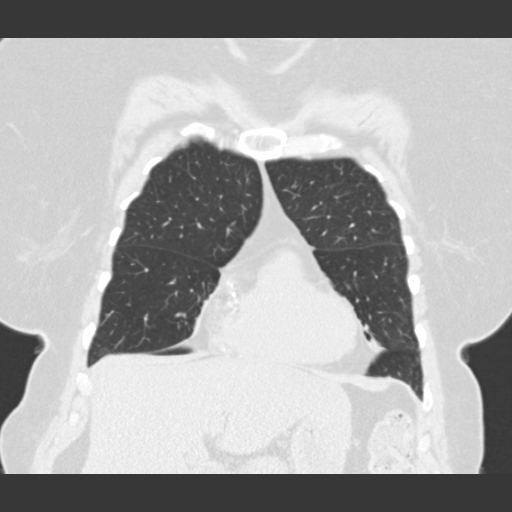
[im 45/112  lung]
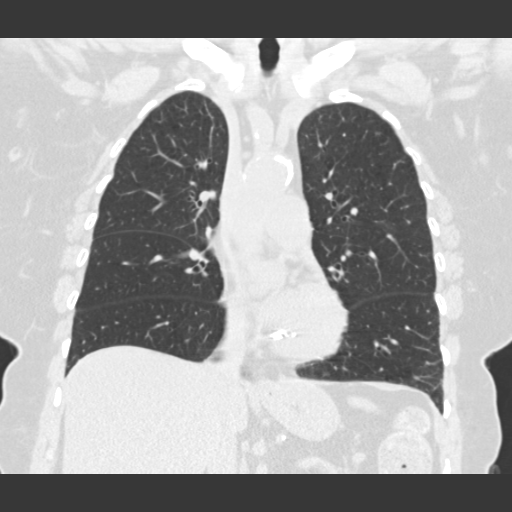
[im 67/112  lung]
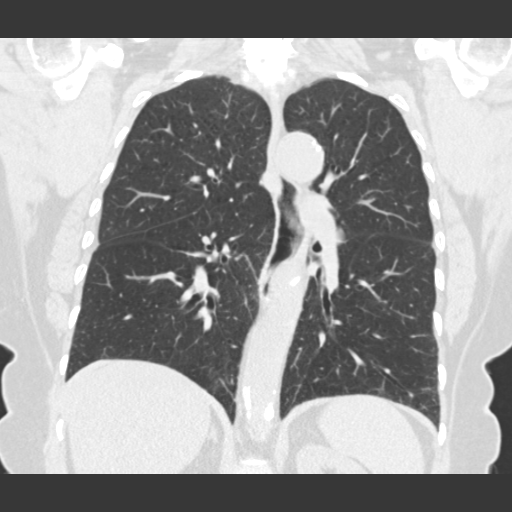

[15 of 36 positions shown; findings below may reference images not displayed]

FINDINGS: No pathologically enlarged mediastinal or axillary lymph nodes.
Hilar regions are difficult to definitively evaluate without IV
contrast. Atherosclerotic calcification of the arterial vasculature,
including at least 2 vessel involvement of the coronary arteries.
Heart size normal. No pericardial effusion. Pre pericardiac and
juxta diaphragmatic lymph nodes are subcentimeter in short axis
size. Esophagus is partially dilated which can be seen with
dysmotility.

Minimal biapical pleural parenchymal scarring. Mild centrilobular
emphysema. A 5 mm (4 x 6 mm) subpleural nodule in the inferior right
middle lobe (image 40) is unchanged. Mild peribronchovascular
airspace disease in the medial aspect of the left upper lobe (images
26-28), new. No pleural fluid. Airway is unremarkable.

Incidental imaging of the upper abdomen shows low attenuation
throughout the visualized portion of the liver, with areas of
peripheral sparing. 9 mm low-attenuation lesion along the upper pole
the left kidney is likely unchanged. No worrisome lytic or sclerotic
lesions. Degenerative changes are seen in the spine.
IMPRESSION: 1. Resolved or stable scattered pulmonary nodular densities,
measuring up to 6 mm in the right middle lobe. Given the patient's
increased risk for bronchogenic carcinoma, additional followup could
be performed in 12 months to ensure continued stability, as
clinically indicated. This recommendation follows the consensus
statement: Guidelines for Management of Small Pulmonary Nodules
Detected on CT Scans: A Statement from the [HOSPITAL] as
published in Radiology 5559; [DATE].
2. Mild peribronchovascular airspace disease in the medial aspect of
the left upper lobe appears new from 05/05/2013 and may be due to
resolving pneumonia, in this patient with a current history of a
cough.
3. Hepatic steatosis.

## 2015-05-11 DIAGNOSIS — H18412 Arcus senilis, left eye: Secondary | ICD-10-CM | POA: Diagnosis not present

## 2015-05-11 DIAGNOSIS — H2511 Age-related nuclear cataract, right eye: Secondary | ICD-10-CM | POA: Diagnosis not present

## 2015-05-11 DIAGNOSIS — H02839 Dermatochalasis of unspecified eye, unspecified eyelid: Secondary | ICD-10-CM | POA: Diagnosis not present

## 2015-05-11 DIAGNOSIS — H18411 Arcus senilis, right eye: Secondary | ICD-10-CM | POA: Diagnosis not present

## 2015-06-07 DIAGNOSIS — H2511 Age-related nuclear cataract, right eye: Secondary | ICD-10-CM | POA: Diagnosis not present

## 2015-06-07 DIAGNOSIS — H25811 Combined forms of age-related cataract, right eye: Secondary | ICD-10-CM | POA: Diagnosis not present

## 2015-07-12 DIAGNOSIS — I10 Essential (primary) hypertension: Secondary | ICD-10-CM | POA: Diagnosis not present

## 2015-07-12 DIAGNOSIS — E119 Type 2 diabetes mellitus without complications: Secondary | ICD-10-CM | POA: Diagnosis not present

## 2015-07-12 DIAGNOSIS — M797 Fibromyalgia: Secondary | ICD-10-CM | POA: Diagnosis not present

## 2015-07-12 DIAGNOSIS — E782 Mixed hyperlipidemia: Secondary | ICD-10-CM | POA: Diagnosis not present

## 2015-07-19 DIAGNOSIS — E119 Type 2 diabetes mellitus without complications: Secondary | ICD-10-CM | POA: Diagnosis not present

## 2015-07-19 DIAGNOSIS — I10 Essential (primary) hypertension: Secondary | ICD-10-CM | POA: Diagnosis not present

## 2015-07-19 DIAGNOSIS — E782 Mixed hyperlipidemia: Secondary | ICD-10-CM | POA: Diagnosis not present

## 2015-07-19 DIAGNOSIS — J449 Chronic obstructive pulmonary disease, unspecified: Secondary | ICD-10-CM | POA: Diagnosis not present

## 2015-08-06 ENCOUNTER — Other Ambulatory Visit: Payer: Self-pay | Admitting: Internal Medicine

## 2015-08-10 DIAGNOSIS — H40003 Preglaucoma, unspecified, bilateral: Secondary | ICD-10-CM | POA: Diagnosis not present

## 2015-11-03 ENCOUNTER — Other Ambulatory Visit: Payer: Self-pay | Admitting: Internal Medicine

## 2015-11-22 DIAGNOSIS — E119 Type 2 diabetes mellitus without complications: Secondary | ICD-10-CM | POA: Diagnosis not present

## 2015-11-22 DIAGNOSIS — Z0000 Encounter for general adult medical examination without abnormal findings: Secondary | ICD-10-CM | POA: Diagnosis not present

## 2015-11-22 DIAGNOSIS — E782 Mixed hyperlipidemia: Secondary | ICD-10-CM | POA: Diagnosis not present

## 2015-11-22 DIAGNOSIS — E6609 Other obesity due to excess calories: Secondary | ICD-10-CM | POA: Diagnosis not present

## 2015-11-22 DIAGNOSIS — Z23 Encounter for immunization: Secondary | ICD-10-CM | POA: Diagnosis not present

## 2015-11-22 DIAGNOSIS — R7301 Impaired fasting glucose: Secondary | ICD-10-CM | POA: Diagnosis not present

## 2015-11-22 DIAGNOSIS — I10 Essential (primary) hypertension: Secondary | ICD-10-CM | POA: Diagnosis not present

## 2015-11-29 DIAGNOSIS — J449 Chronic obstructive pulmonary disease, unspecified: Secondary | ICD-10-CM | POA: Diagnosis not present

## 2015-11-29 DIAGNOSIS — I10 Essential (primary) hypertension: Secondary | ICD-10-CM | POA: Diagnosis not present

## 2015-11-29 DIAGNOSIS — E782 Mixed hyperlipidemia: Secondary | ICD-10-CM | POA: Diagnosis not present

## 2015-11-29 DIAGNOSIS — E119 Type 2 diabetes mellitus without complications: Secondary | ICD-10-CM | POA: Diagnosis not present

## 2016-03-06 DIAGNOSIS — I10 Essential (primary) hypertension: Secondary | ICD-10-CM | POA: Diagnosis not present

## 2016-03-06 DIAGNOSIS — E782 Mixed hyperlipidemia: Secondary | ICD-10-CM | POA: Diagnosis not present

## 2016-03-06 DIAGNOSIS — E119 Type 2 diabetes mellitus without complications: Secondary | ICD-10-CM | POA: Diagnosis not present

## 2016-03-13 DIAGNOSIS — I10 Essential (primary) hypertension: Secondary | ICD-10-CM | POA: Diagnosis not present

## 2016-03-13 DIAGNOSIS — E782 Mixed hyperlipidemia: Secondary | ICD-10-CM | POA: Diagnosis not present

## 2016-03-13 DIAGNOSIS — E119 Type 2 diabetes mellitus without complications: Secondary | ICD-10-CM | POA: Diagnosis not present

## 2016-07-31 DIAGNOSIS — E782 Mixed hyperlipidemia: Secondary | ICD-10-CM | POA: Diagnosis not present

## 2016-07-31 DIAGNOSIS — E119 Type 2 diabetes mellitus without complications: Secondary | ICD-10-CM | POA: Diagnosis not present

## 2016-07-31 DIAGNOSIS — I10 Essential (primary) hypertension: Secondary | ICD-10-CM | POA: Diagnosis not present

## 2016-08-07 DIAGNOSIS — F329 Major depressive disorder, single episode, unspecified: Secondary | ICD-10-CM | POA: Diagnosis not present

## 2016-08-07 DIAGNOSIS — E119 Type 2 diabetes mellitus without complications: Secondary | ICD-10-CM | POA: Diagnosis not present

## 2016-08-07 DIAGNOSIS — Z0000 Encounter for general adult medical examination without abnormal findings: Secondary | ICD-10-CM | POA: Diagnosis not present

## 2016-08-15 ENCOUNTER — Other Ambulatory Visit: Payer: Self-pay

## 2016-10-30 DIAGNOSIS — Z23 Encounter for immunization: Secondary | ICD-10-CM | POA: Diagnosis not present

## 2016-11-21 ENCOUNTER — Encounter: Payer: Self-pay | Admitting: Pulmonary Disease

## 2016-11-21 ENCOUNTER — Ambulatory Visit (INDEPENDENT_AMBULATORY_CARE_PROVIDER_SITE_OTHER): Payer: Medicare Other | Admitting: Pulmonary Disease

## 2016-11-21 VITALS — BP 102/74 | HR 80 | Ht 61.5 in | Wt 230.2 lb

## 2016-11-21 DIAGNOSIS — G4733 Obstructive sleep apnea (adult) (pediatric): Secondary | ICD-10-CM

## 2016-11-21 DIAGNOSIS — R059 Cough, unspecified: Secondary | ICD-10-CM

## 2016-11-21 DIAGNOSIS — R911 Solitary pulmonary nodule: Secondary | ICD-10-CM | POA: Diagnosis not present

## 2016-11-21 DIAGNOSIS — R05 Cough: Secondary | ICD-10-CM | POA: Diagnosis not present

## 2016-11-21 LAB — NITRIC OXIDE: Nitric Oxide: 30

## 2016-11-21 MED ORDER — FLUTICASONE FUROATE-VILANTEROL 100-25 MCG/INH IN AEPB: 1.0000 | INHALATION_SPRAY | Freq: Every day | RESPIRATORY_TRACT | 5 refills | Status: DC

## 2016-11-21 NOTE — Progress Notes (Signed)
Current Outpatient Prescriptions on File Prior to Visit  Medication Sig  . albuterol (PROAIR HFA) 108 (90 BASE) MCG/ACT inhaler Inhale 2 puffs into the lungs every 6 (six) hours as needed for wheezing.  Marland Kitchen. amLODipine (NORVASC) 5 MG tablet Take 5 mg by mouth daily.  Marland Kitchen. atorvastatin (LIPITOR) 40 MG tablet Take 40 mg by mouth daily.  . Cholecalciferol (VITAMIN D-3) 1000 UNITS CAPS Take by mouth daily.  . cyanocobalamin 1000 MCG tablet Take 100 mcg by mouth daily.  . cyclobenzaprine (FLEXERIL) 10 MG tablet Take 10 mg by mouth 3 (three) times daily as needed.   . fish oil-omega-3 fatty acids 1000 MG capsule Take 2 g by mouth daily.  . furosemide (LASIX) 20 MG tablet Daily for 3 days, then as needed for weight gain associated with leg swelling  . MAGNESIUM PO Take by mouth. Magnesium with zinc-Take 3 daily  . Milk Thistle 1000 MG CAPS Take by mouth daily.  . Multiple Vitamins-Minerals (CENTRUM SILVER PO) Take by mouth daily.  . potassium chloride (K-DUR) 10 MEQ tablet Daily for 3 days, then as needed when taking lasix  . Potassium Gluconate 2 MEQ TABS Take by mouth daily.  Marland Kitchen. pyridoxine (B-6) 100 MG tablet Take 100 mg by mouth daily.  Marland Kitchen. telmisartan-hydrochlorothiazide (MICARDIS HCT) 80-25 MG per tablet Take 1 tablet by mouth daily.  . Turmeric Curcumin 500 MG CAPS Take by mouth. Take 2 daily  . vitamin C (ASCORBIC ACID) 500 MG tablet Take 500 mg by mouth daily.  . vitamin E 400 UNIT capsule Take 400 Units by mouth daily.   No current facility-administered medications on file prior to visit.     Chief Complaint  Patient presents with  . Follow-up    Pt c/o thick yellow mucus production x 6 months or more. Pt also notes some cough, wheezing and SOB. Still wears CPAP machine nightly. Denies problems with mask/pressure. [facility]    Pulmonary tests PFT 04/23/13 >> FEV1 2.0 (109%), FEV1% 79, TLC 4.48 (109%), DLCO 95%  CT chest 05/14/13 >> mild centrilobular emphysema, coronary calcifications, 6 mm RML  nodule FeNO 11/21/16 >> 30 Spirometry 12/105/17 >> FEV1 1.71 (91%), FEV1% 74  Sleep tests ONO with RA 04/27/13 >> Test time 9 hrs 28 min.  Baseline SpO2 97%, low SpO2 54%.  Spent 166 min with SpO2 < 88% PSG 08/04/13 >> AHI 59.6, SpO2 low 83%, PLMI 0. BiPAP 10/22/13 >> BiPAP 12/8 cm H2O >> AHI 1.3, +R. PLMI 42. ONO with BiPAP 03/26/14 >> Test tim 5 hrs 49 min. Mean SpO2 93%, low SpO2 85%. Spent 2 min with SpO2 < 89%. BiPAP 07/17/14 to 10/14/14 >> used on 89 of 90 nights with average 6 hrs 45 minutes. Average AHI 1.1 with BiPAP 12/8 cm H2O.  Past medical history Anxiety, Depression, DM, HLD, HTN, OA  Past surgical history, Family history, Social history, Allergies reviewed  Vital signs BP 102/74 (BP Location: Left Arm, Cuff Size: Normal)   Pulse 80   Ht 5' 1.5" (1.562 m)   Wt 230 lb 3.2 oz (104.4 kg)   SpO2 97%   BMI 42.79 kg/m   History of Present Illness: Anne Juarez is a 75 y.o. 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  She also chronic bronchitis, and lung nodule.  I last saw her in 2015.  Her son had cancer, and she was not able to focus on herself.  She has cough with clear to yellow sputum.  She gets episodes of wheezing  also.  She uses spiriva and this helps some.  She uses proair several times per day.  She gets winded with activity.    She uses Bipap nightly.  She can't sleep without this.  She has not had chest xray or CT scan since 2015.  Physical Exam:  General - No distress ENT - No sinus tenderness, no oral exudate, no LAN Cardiac - s1s2 regular, no murmur Chest - decreased breath sounds, faint basilar rales Back - No focal tenderness Abd - Soft, non-tender Ext - no edema Neuro - Normal strength Skin - No rashes Psych - normal mood, and behavior   Assessment/Plan:  Obstructive sleep apnea. - will get copy of her bipap download   Chronic bronchitis - will change to breo - continue prn proair  Lung nodule. - will arrange for CT chest  w/o contrast  Obesity. - discussed importance of weight loss   Patient Instructions  Stop spiriva  Breo one puff daily >> rinse mouth after each use  Proair two puffs every 4 to 6 hours as needed for cough, wheeze, or chest congestion  Will schedule CT chest  Will get copy of Bipap download  Follow up with Dr. Craige CottaSood or Nurse Practitioner in 6 weeks    Anne HellingVineet Briar Witherspoon, MD [place] [facility]/Critical [facility]/Sleep Pager:  [phone] 11/21/2016, 3:39 PM

## 2016-11-21 NOTE — Patient Instructions (Signed)
Stop spiriva  Breo one puff daily >> rinse mouth after each use  Proair two puffs every 4 to 6 hours as needed for cough, wheeze, or chest congestion  Will schedule CT chest  Will get copy of Bipap download  Follow up with Dr. Craige CottaSood or Nurse Practitioner in 6 weeks

## 2016-11-22 ENCOUNTER — Telehealth: Payer: Self-pay | Admitting: Pulmonary Disease

## 2016-11-22 NOTE — Telephone Encounter (Signed)
Pt states Breo inhaler is too expensive and would like something different called in at pharmacy.   VS please advise. Thanks.

## 2016-11-23 NOTE — Telephone Encounter (Signed)
Can we check with her insurance which ICS/LABA inhaler is covered by them.

## 2016-11-23 NOTE — Telephone Encounter (Signed)
Spoke with pt. She will call her insurance company and then follow up with us with what they will cover. Will awake her call back

## 2016-11-27 ENCOUNTER — Other Ambulatory Visit: Payer: Self-pay

## 2016-11-27 MED ORDER — MOMETASONE FURO-FORMOTEROL FUM 100-5 MCG/ACT IN AERO: 2.0000 | INHALATION_SPRAY | Freq: Two times a day (BID) | RESPIRATORY_TRACT | 3 refills | Status: DC

## 2016-11-27 NOTE — Telephone Encounter (Signed)
Can change to dulera 100, two puffs bid, 1 month supply with 5 refills.

## 2016-11-27 NOTE — Telephone Encounter (Signed)
Spoke with patient and advised her of the changes. Rx was sent to [facility]. Pt. Verbalized understanding.

## 2016-11-27 NOTE — Telephone Encounter (Signed)
Spoke with patient, pt states that she spoke with Medicare regarding her Virgel BouquetBreo Rx Pt states that she was unable to get any information from her Medicare - she was unable to get through to a live person. Explained that I would contact Medicare to see if I could find out what is covered by her insurance.   I called Medicare [phone], placed on a very long hold. After 10 minutes waiting and the call time not decreasing I hung up. Per the formulary online the best information I could find was were Arnuity - tier 4 Dulera - tier 2 Pulmicort flex - tier 2 Qvar - tier 2 Symbicort - tier 2  Please advise Dr Craige CottaSood. Thanks.

## 2016-11-27 NOTE — Telephone Encounter (Signed)
Spoke with patient-she is calling [employer] today to see if they can help her out with formulary list and what cheaper alternatives we could send her.   Pt has Medicare's number.  Pt to call us back

## 2016-11-28 ENCOUNTER — Other Ambulatory Visit: Payer: Medicare Other

## 2016-11-28 ENCOUNTER — Ambulatory Visit (INDEPENDENT_AMBULATORY_CARE_PROVIDER_SITE_OTHER)
Admission: RE | Admit: 2016-11-28 | Discharge: 2016-11-28 | Disposition: A | Discharge status: Home / Self Care | Payer: Medicare Other | Source: Ambulatory Visit | Attending: Pulmonary Disease | Admitting: Pulmonary Disease

## 2016-11-28 DIAGNOSIS — E119 Type 2 diabetes mellitus without complications: Secondary | ICD-10-CM | POA: Diagnosis not present

## 2016-11-28 DIAGNOSIS — E6609 Other obesity due to excess calories: Secondary | ICD-10-CM | POA: Diagnosis not present

## 2016-11-28 DIAGNOSIS — E559 Vitamin D deficiency, unspecified: Secondary | ICD-10-CM | POA: Diagnosis not present

## 2016-11-28 DIAGNOSIS — E782 Mixed hyperlipidemia: Secondary | ICD-10-CM | POA: Diagnosis not present

## 2016-11-28 DIAGNOSIS — R911 Solitary pulmonary nodule: Secondary | ICD-10-CM

## 2016-11-28 DIAGNOSIS — R918 Other nonspecific abnormal finding of lung field: Secondary | ICD-10-CM | POA: Diagnosis not present

## 2016-11-28 DIAGNOSIS — I10 Essential (primary) hypertension: Secondary | ICD-10-CM | POA: Diagnosis not present

## 2016-11-28 DIAGNOSIS — Z0000 Encounter for general adult medical examination without abnormal findings: Secondary | ICD-10-CM | POA: Diagnosis not present

## 2016-11-29 ENCOUNTER — Telehealth: Payer: Self-pay | Admitting: Pulmonary Disease

## 2016-11-29 DIAGNOSIS — H2512 Age-related nuclear cataract, left eye: Secondary | ICD-10-CM | POA: Diagnosis not present

## 2016-11-29 DIAGNOSIS — H26491 Other secondary cataract, right eye: Secondary | ICD-10-CM | POA: Diagnosis not present

## 2016-11-29 DIAGNOSIS — H524 Presbyopia: Secondary | ICD-10-CM | POA: Diagnosis not present

## 2016-11-29 DIAGNOSIS — H40003 Preglaucoma, unspecified, bilateral: Secondary | ICD-10-CM | POA: Diagnosis not present

## 2016-11-29 DIAGNOSIS — H53001 Unspecified amblyopia, right eye: Secondary | ICD-10-CM | POA: Diagnosis not present

## 2016-11-29 DIAGNOSIS — Z961 Presence of intraocular lens: Secondary | ICD-10-CM | POA: Diagnosis not present

## 2016-11-29 NOTE — Telephone Encounter (Signed)
CT chest 11/28/16 >> coronary calcification, mild centrilobular emphysema, mild peripheral BTX lower lobes and RML, 4 mm RML and RLL nodules no change since 2014   Left message explaining lung nodules stable and no additional follow up needed.  Advised her to call back with questions.

## 2016-12-05 DIAGNOSIS — E782 Mixed hyperlipidemia: Secondary | ICD-10-CM | POA: Diagnosis not present

## 2016-12-05 DIAGNOSIS — E119 Type 2 diabetes mellitus without complications: Secondary | ICD-10-CM | POA: Diagnosis not present

## 2016-12-05 DIAGNOSIS — K861 Other chronic pancreatitis: Secondary | ICD-10-CM | POA: Diagnosis not present

## 2016-12-05 DIAGNOSIS — J449 Chronic obstructive pulmonary disease, unspecified: Secondary | ICD-10-CM | POA: Diagnosis not present

## 2016-12-05 DIAGNOSIS — I10 Essential (primary) hypertension: Secondary | ICD-10-CM | POA: Diagnosis not present

## 2017-01-02 ENCOUNTER — Ambulatory Visit (INDEPENDENT_AMBULATORY_CARE_PROVIDER_SITE_OTHER): Payer: Medicare Other | Admitting: Adult Health

## 2017-01-02 ENCOUNTER — Encounter: Payer: Self-pay | Admitting: Adult Health

## 2017-01-02 DIAGNOSIS — G4733 Obstructive sleep apnea (adult) (pediatric): Secondary | ICD-10-CM

## 2017-01-02 DIAGNOSIS — J439 Emphysema, unspecified: Secondary | ICD-10-CM

## 2017-01-02 DIAGNOSIS — R911 Solitary pulmonary nodule: Secondary | ICD-10-CM | POA: Diagnosis not present

## 2017-01-02 MED ORDER — FLUTICASONE-SALMETEROL 100-50 MCG/DOSE IN AEPB: 1.0000 | INHALATION_SPRAY | Freq: Two times a day (BID) | RESPIRATORY_TRACT | 3 refills | Status: AC

## 2017-01-02 NOTE — Assessment & Plan Note (Signed)
Chronic Bronchitis /Emphysema  Improved sx control on ICS/LABA - change to Advair (formulary coverage )

## 2017-01-02 NOTE — Patient Instructions (Addendum)
May change BREO to Advair 100 1 puff Twice daily  , brush/rinse /gargle after use.  Continue on BIPAP At bedtime   Follow up Dr. Craige CottaSood  In 4 months and As needed

## 2017-01-02 NOTE — Assessment & Plan Note (Signed)
Stable on CT 11/2016 -stable since 2014 , no additionaal follow up indicated.

## 2017-01-02 NOTE — Progress Notes (Signed)
@Patient  ID: Anne Juarez, female    DOB: [birth year], 76 y.o.   MRN: [id number]  Chief Complaint  Patient presents with  . Follow-up    Cough     Referring provider: Georgianne Fick, MD  HPI: 76 yo followed for chronic bronchitis, lung nodule and OSA/OHS on BIPAP   TEST  Pulmonary tests PFT 04/23/13 >> FEV1 2.0 (109%), FEV1% 79, TLC 4.48 (109%), DLCO 95%  CT chest 05/14/13 >> mild centrilobular emphysema, coronary calcifications, 6 mm RML nodule FeNO 11/21/16 >> 30 Spirometry 12/105/17 >> FEV1 1.71 (91%), FEV1% 74  Sleep tests ONO with RA 04/27/13 >> Test time 9 hrs 28 min.  Baseline SpO2 97%, low SpO2 54%.  Spent 166 min with SpO2 < 88% PSG 08/04/13 >> AHI 59.6, SpO2 low 83%, PLMI 0. BiPAP 10/22/13 >> BiPAP 12/8 cm H2O >> AHI 1.3, +R. PLMI 42. ONO with BiPAP 03/26/14 >> Test tim 5 hrs 49 min. Mean SpO2 93%, low SpO2 85%. Spent 2 min with SpO2 < 89%. BiPAP 07/17/14 to 10/14/14 >> used on 89 of 90 nights with average 6 hrs 45 minutes. Average AHI 1.1 with BiPAP 12/8 cm H2O.   01/02/2017 Follow up : Chronic Bronchitis /lung nodule /OHS/OSA Pt returns for a 1 month follow up .she was changed from [facility] to [facility] last ov. She says she likes and feels it helping but insurance will not cover. Insurance will cover Advair . We discussed changing this to help with cost.  She denies flare of cough or wheezing   Continues on BIPAP .Marland KitchenAt bedtime  Without difficulty .   Had CT chest in 11/2016 showed stable lung nodule. Stable since 2014 . W/ no additional follow up is indicated.    Allergies  Allergen Reactions  . Codeine Itching    Immunization History  Administered Date(s) Administered  . Influenza Split 10/18/2012, 09/22/2013  . Influenza,inj,Quad PF,36+ Mos 10/06/2014, 10/31/2016  . Pneumococcal Conjugate-13 10/12/2014  . Pneumococcal Polysaccharide-23 12/18/2010    Past Medical History:  Diagnosis Date  . Anxiety   . Depression   . Diabetes mellitus without complication  (HCC)   . Hyperlipidemia   . Hypertension   . OSA (obstructive sleep apnea) 07/01/2013  . Osteoarthritis (arthritis due to wear and tear of joints)    neck and knees,pelvic bone and left shoulder    Tobacco History: History  Smoking Status  . Former Smoker  . Packs/day: 2.50  . Years: 45.00  . Types: Cigarettes  . Quit date: 03/18/2002  Smokeless Tobacco  . Never Used   Counseling given: Not Answered   Outpatient Encounter Prescriptions as of 01/02/2017  Medication Sig  . albuterol (PROAIR HFA) 108 (90 BASE) MCG/ACT inhaler Inhale 2 puffs into the lungs every 6 (six) hours as needed for wheezing.  Marland Kitchen amLODipine (NORVASC) 5 MG tablet Take 5 mg by mouth daily.  Marland Kitchen atorvastatin (LIPITOR) 40 MG tablet Take 40 mg by mouth daily.  . Cholecalciferol (VITAMIN D-3) 1000 UNITS CAPS Take by mouth daily.  . cyanocobalamin 1000 MCG tablet Take 100 mcg by mouth daily.  . cyclobenzaprine (FLEXERIL) 10 MG tablet Take 10 mg by mouth 3 (three) times daily as needed.   . fish oil-omega-3 fatty acids 1000 MG capsule Take 2 g by mouth daily.  . furosemide (LASIX) 20 MG tablet Daily for 3 days, then as needed for weight gain associated with leg swelling  . Multiple Vitamins-Minerals (CENTRUM SILVER PO) Take by mouth daily.  . potassium chloride (K-DUR) 10  MEQ tablet Daily for 3 days, then as needed when taking lasix  . Potassium Gluconate 2 MEQ TABS Take by mouth daily.  Marland Kitchen. pyridoxine (B-6) 100 MG tablet Take 100 mg by mouth daily.  Marland Kitchen. telmisartan-hydrochlorothiazide (MICARDIS HCT) 80-25 MG per tablet Take 1 tablet by mouth daily.  . Turmeric Curcumin 500 MG CAPS Take by mouth. Take 2 daily  . vitamin C (ASCORBIC ACID) 500 MG tablet Take 500 mg by mouth daily.  . vitamin E 400 UNIT capsule Take 400 Units by mouth daily.  . [DISCONTINUED] fluticasone furoate-vilanterol (BREO ELLIPTA) 100-25 MCG/INH AEPB Inhale 1 puff into the lungs daily.  . Fluticasone-Salmeterol (ADVAIR) 100-50 MCG/DOSE AEPB Inhale 1  puff into the lungs 2 (two) times daily.  . [DISCONTINUED] MAGNESIUM PO Take by mouth. Magnesium with zinc-Take 3 daily  . [DISCONTINUED] Milk Thistle 1000 MG CAPS Take by mouth daily.  . [DISCONTINUED] mometasone-formoterol (DULERA) 100-5 MCG/ACT AERO Inhale 2 puffs into the lungs 2 (two) times daily. (Patient not taking: Reported on 01/02/2017)   No facility-administered encounter medications on file as of 01/02/2017.      Review of Systems  Constitutional:   No  weight loss, night sweats,  Fevers, chills, fatigue, or  lassitude.  HEENT:   No headaches,  Difficulty swallowing,  Tooth/dental problems, or  Sore throat,                No sneezing, itching, ear ache, nasal congestion, post nasal drip,   CV:  No chest pain,  Orthopnea, PND, swelling in lower extremities, anasarca, dizziness, palpitations, syncope.   GI  No heartburn, indigestion, abdominal pain, nausea, vomiting, diarrhea, change in bowel habits, loss of appetite, bloody stools.   Resp: No shortness of breath with exertion or at rest.  No excess mucus, no productive cough,  No non-productive cough,  No coughing up of blood.  No change in color of mucus.  No wheezing.  No chest wall deformity  Skin: no rash or lesions.  GU: no dysuria, change in color of urine, no urgency or frequency.  No flank pain, no hematuria   MS:  No joint pain or swelling.  No decreased range of motion.  No back pain.    Physical Exam  BP 122/72   Pulse 91   Ht 5\' 2"  (1.575 m)   Wt 234 lb 9.6 oz (106.4 kg)   SpO2 100%   BMI 42.91 kg/m   GEN: A/Ox3; pleasant , NAD,  Obese    HEENT:  [place]/AT,  EACs-clear, TMs-wnl, NOSE-clear, THROAT-clear, no lesions, no postnasal drip or exudate noted. Class 2-3MP   NECK:  Supple w/ fair ROM; no JVD; normal carotid impulses w/o bruits; no thyromegaly or nodules palpated; no lymphadenopathy.    RESP  Clear  P & A; w/o, wheezes/ rales/ or rhonchi. no accessory muscle use, no dullness to percussion  CARD:   RRR, no m/r/g, tr peripheral edema, pulses intact, no cyanosis or clubbing.  GI:   Soft & nt; nml bowel sounds; no organomegaly or masses detected.   Musco: Warm bil, no deformities or joint swelling noted.   Neuro: alert, no focal deficits noted.    Skin: Warm, no lesions or rashes  Psych:  No change in mood or affect. No depression or anxiety.  No memory loss.  Lab Results:  CBC    Component Value Date/Time   WBC 8.0 02/13/2014 1104   RBC 4.41 02/13/2014 1104   HGB 13.6 02/13/2014 1104   HCT  41.5 02/13/2014 1104   PLT 229.0 02/13/2014 1104   MCV 94.0 02/13/2014 1104   MCHC 32.8 02/13/2014 1104   RDW 14.5 02/13/2014 1104   LYMPHSABS 2.8 02/13/2014 1104   MONOABS 0.7 02/13/2014 1104   EOSABS 0.2 02/13/2014 1104   BASOSABS 0.0 02/13/2014 1104    BMET    Component Value Date/Time   NA 135 02/13/2014 1104   K 3.3 (L) 02/13/2014 1104   CL 98 02/13/2014 1104   CO2 31 02/13/2014 1104   GLUCOSE 112 (H) 02/13/2014 1104   BUN 12 02/13/2014 1104   CREATININE 0.7 02/13/2014 1104   CALCIUM 9.3 02/13/2014 1104    BNP No results found for: BNP  ProBNP    Component Value Date/Time   PROBNP 37.0 02/13/2014 1104    Imaging: No results found.   Assessment & Plan:   Pulmonary nodule, right Stable on CT 11/2016 -stable since 2014 , no additionaal follow up indicated.   Pulmonary emphysema Chronic Bronchitis /Emphysema  Improved sx control on ICS/LABA - change to Advair (formulary coverage )    OSA (obstructive sleep apnea) Cont on BIPAP At bedtime       Rubye Oaks, NP 01/02/2017

## 2017-01-02 NOTE — Assessment & Plan Note (Signed)
Cont on BIPAP At bedtime   

## 2017-01-04 NOTE — Progress Notes (Signed)
I have reviewed and agree with assessment/plan.  Kervin Bones, MD [facility] 01/04/2017, 5:43 PM Pager:  [phone]  

## 2017-04-12 DIAGNOSIS — H40013 Open angle with borderline findings, low risk, bilateral: Secondary | ICD-10-CM | POA: Diagnosis not present

## 2017-05-02 ENCOUNTER — Encounter: Payer: Self-pay | Admitting: Pulmonary Disease

## 2017-05-02 ENCOUNTER — Ambulatory Visit (INDEPENDENT_AMBULATORY_CARE_PROVIDER_SITE_OTHER)
Admission: RE | Admit: 2017-05-02 | Discharge: 2017-05-02 | Disposition: A | Discharge status: Home / Self Care | Payer: Medicare Other | Source: Ambulatory Visit | Attending: Pulmonary Disease | Admitting: Pulmonary Disease

## 2017-05-02 ENCOUNTER — Other Ambulatory Visit (INDEPENDENT_AMBULATORY_CARE_PROVIDER_SITE_OTHER): Payer: Medicare Other

## 2017-05-02 ENCOUNTER — Ambulatory Visit (INDEPENDENT_AMBULATORY_CARE_PROVIDER_SITE_OTHER): Payer: Medicare Other | Admitting: Pulmonary Disease

## 2017-05-02 VITALS — BP 102/68 | HR 77 | Ht 62.0 in | Wt 233.0 lb

## 2017-05-02 DIAGNOSIS — R0600 Dyspnea, unspecified: Secondary | ICD-10-CM

## 2017-05-02 DIAGNOSIS — G4733 Obstructive sleep apnea (adult) (pediatric): Secondary | ICD-10-CM | POA: Diagnosis not present

## 2017-05-02 DIAGNOSIS — J411 Mucopurulent chronic bronchitis: Secondary | ICD-10-CM | POA: Diagnosis not present

## 2017-05-02 DIAGNOSIS — R0609 Other forms of dyspnea: Secondary | ICD-10-CM

## 2017-05-02 DIAGNOSIS — Z6841 Body Mass Index (BMI) 40.0 and over, adult: Secondary | ICD-10-CM

## 2017-05-02 DIAGNOSIS — J449 Chronic obstructive pulmonary disease, unspecified: Secondary | ICD-10-CM | POA: Diagnosis not present

## 2017-05-02 LAB — COMPREHENSIVE METABOLIC PANEL
ALT: 34 U/L (ref 0–35)
AST: 19 U/L (ref 0–37)
Albumin: 4.3 g/dL (ref 3.5–5.2)
Alkaline Phosphatase: 81 U/L (ref 39–117)
BUN: 10 mg/dL (ref 6–23)
CO2: 27 mEq/L (ref 19–32)
Calcium: 9.7 mg/dL (ref 8.4–10.5)
Chloride: 103 mEq/L (ref 96–112)
Creatinine, Ser: 0.79 mg/dL (ref 0.40–1.20)
GFR: 75.13 mL/min (ref >60.00)
Glucose, Bld: 119 mg/dL — ABNORMAL HIGH (ref 70–99)
Potassium: 3.7 mEq/L (ref 3.5–5.1)
Sodium: 136 mEq/L (ref 135–145)
Total Bilirubin: 0.4 mg/dL (ref 0.2–1.2)
Total Protein: 7.4 g/dL (ref 6.0–8.3)

## 2017-05-02 LAB — CBC WITH DIFFERENTIAL/PLATELET
Basophils Absolute: 0.1 10*3/uL (ref 0.0–0.1)
Basophils Relative: 0.7 % (ref 0.0–3.0)
Eosinophils Absolute: 0.2 10*3/uL (ref 0.0–0.7)
Eosinophils Relative: 2 % (ref 0.0–5.0)
HCT: 40.4 % (ref 36.0–46.0)
Hemoglobin: 13.7 g/dL (ref 12.0–15.0)
Lymphocytes Relative: 32.4 % (ref 12.0–46.0)
Lymphs Abs: 3.1 10*3/uL (ref 0.7–4.0)
MCHC: 33.8 g/dL (ref 30.0–36.0)
MCV: 88.6 fl (ref 78.0–100.0)
Monocytes Absolute: 0.9 10*3/uL (ref 0.1–1.0)
Monocytes Relative: 9.5 % (ref 3.0–12.0)
Neutro Abs: 5.3 10*3/uL (ref 1.4–7.7)
Neutrophils Relative %: 55.4 % (ref 43.0–77.0)
Platelets: 267 10*3/uL (ref 150.0–400.0)
RBC: 4.56 Mil/uL (ref 3.87–5.11)
RDW: 15.5 % (ref 11.5–15.5)
WBC: 9.6 10*3/uL (ref 4.0–10.5)

## 2017-05-02 NOTE — Patient Instructions (Signed)
Chest xray and lab tests today Will arrange for pulmonary function test and echocardiogram Will arrange for overnight oxygen test with Bipap  Follow up in 4 weeks with Dr. Craige CottaSood or Nurse Practitioner

## 2017-05-02 NOTE — Progress Notes (Signed)
Current Outpatient Prescriptions on File Prior to Visit  Medication Sig  . albuterol (PROAIR HFA) 108 (90 BASE) MCG/ACT inhaler Inhale 2 puffs into the lungs every 6 (six) hours as needed for wheezing.  Marland Kitchen amLODipine (NORVASC) 5 MG tablet Take 5 mg by mouth daily.  Marland Kitchen atorvastatin (LIPITOR) 40 MG tablet Take 40 mg by mouth daily.  . Cholecalciferol (VITAMIN D-3) 1000 UNITS CAPS Take by mouth daily.  . cyanocobalamin 1000 MCG tablet Take 100 mcg by mouth daily.  . cyclobenzaprine (FLEXERIL) 10 MG tablet Take 10 mg by mouth 3 (three) times daily as needed.   . fish oil-omega-3 fatty acids 1000 MG capsule Take 2 g by mouth daily.  . Fluticasone-Salmeterol (ADVAIR) 100-50 MCG/DOSE AEPB Inhale 1 puff into the lungs 2 (two) times daily.  . furosemide (LASIX) 20 MG tablet Daily for 3 days, then as needed for weight gain associated with leg swelling  . Multiple Vitamins-Minerals (CENTRUM SILVER PO) Take by mouth daily.  . potassium chloride (K-DUR) 10 MEQ tablet Daily for 3 days, then as needed when taking lasix  . Potassium Gluconate 2 MEQ TABS Take by mouth daily.  Marland Kitchen pyridoxine (B-6) 100 MG tablet Take 100 mg by mouth daily.  Marland Kitchen telmisartan-hydrochlorothiazide (MICARDIS HCT) 80-25 MG per tablet Take 1 tablet by mouth daily.  . Turmeric Curcumin 500 MG CAPS Take by mouth. Take 2 daily  . vitamin C (ASCORBIC ACID) 500 MG tablet Take 500 mg by mouth daily.  . vitamin E 400 UNIT capsule Take 400 Units by mouth daily.   No current facility-administered medications on file prior to visit.     Chief Complaint  Patient presents with  . Follow-up    Pt notes much improvement in cough since last OV. Pt states that the Advair 100 seems to be working good ; Uses BiPAP nightly. No new complaints.     Pulmonary tests PFT 04/23/13 >> FEV1 2.0 (109%), FEV1% 79, TLC 4.48 (109%), DLCO 95%  CT chest 05/14/13 >> mild centrilobular emphysema, coronary calcifications, 6 mm RML nodule FeNO 11/21/16 >> 30 Spirometry  12/105/17 >> FEV1 1.71 (91%), FEV1% 74 CT chest 11/28/16 >> no change 4 mm RML and RLL nodules, atherosclerosis  Sleep tests ONO with RA 04/27/13 >> Test time 9 hrs 28 min.  Baseline SpO2 97%, low SpO2 54%.  Spent 166 min with SpO2 < 88% PSG 08/04/13 >> AHI 59.6, SpO2 low 83%, PLMI 0. BiPAP 10/22/13 >> BiPAP 12/8 cm H2O >> AHI 1.3, +R. PLMI 42. ONO with BiPAP 03/26/14 >> Test tim 5 hrs 49 min. Mean SpO2 93%, low SpO2 85%. Spent 2 min with SpO2 < 89%. BiPAP 07/17/14 to 10/14/14 >> used on 89 of 90 nights with average 6 hrs 45 minutes. Average AHI 1.1 with BiPAP 12/8 cm H2O.  Past medical history Anxiety, Depression, DM, HLD, HTN, OA  Past surgical history, Family history, Social history, Allergies reviewed  Vital signs BP 102/68 (BP Location: Left Arm, Cuff Size: Normal)   Pulse 77   Ht 5\' 2"  (1.575 m)   Wt 233 lb (105.7 kg)   SpO2 100%   BMI 42.62 kg/m   History of Present Illness: Anne Juarez is a 76 y.o. 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  She also chronic bronchitis, and lung nodule.  She has been getting more short of breath with exertion.  She has been getting stiffness in her legs also.  She sometimes feels short of breath while talking also.  She  is not having much cough, or wheeze.  She denies chest pain or leg swelling.  She will walk for a few minutes and get short of breath.  She recovers after sitting for few minutes.  She is using her Bipap nightly.   Physical Exam:  General - pleasant Eyes - pupils reactive ENT - no sinus tenderness, no oral exudate, no LAN Cardiac - regular, no murmur Chest - no wheeze, rales Abd - soft, non tender Ext - no edema Skin - no rashes Neuro - normal strength Psych - normal mood   Ambulatory oximetry on room air >> SpO2 low 95%   Assessment/Plan:  Dyspnea on exertion. - will get labs, CXR, PFT, and Echo - further assessment based on test results  Obstructive sleep apnea. - she reports compliance and  benefit - will get copy of her Bipap download - will arrange for ONO with Bipap and room air - will get copy of her bipap download   Chronic bronchitis - continue advair and prn albuterol  Obesity. - certain deconditioning could be playing a role with her breathing   Patient Instructions  Chest xray and lab tests today Will arrange for pulmonary function test and echocardiogram Will arrange for overnight oxygen test with Bipap  Follow up in 4 weeks with Dr. Craige CottaSood or Nurse Practitioner    Coralyn HellingVineet Evin Loiseau, MD [place] Pager:  [phone] 05/02/2017, 12:31 PM

## 2017-05-03 ENCOUNTER — Telehealth: Payer: Self-pay | Admitting: Pulmonary Disease

## 2017-05-03 MED ORDER — DOXYCYCLINE HYCLATE 100 MG PO TABS: 100.0000 mg | ORAL_TABLET | Freq: Two times a day (BID) | ORAL | 0 refills | Status: DC

## 2017-05-03 NOTE — Telephone Encounter (Signed)
I am aware this will not qualify patient for home oxygen set up.  I am doing the test to better determine if she needs to get set up for in lab titration study.

## 2017-05-03 NOTE — Telephone Encounter (Signed)
[facility] recs.  Nothing further needed.

## 2017-05-03 NOTE — Telephone Encounter (Signed)
Dg Chest 2 View  Result Date: 05/02/2017 CLINICAL DATA:  Chronic dyspnea with increasing symptoms. History of COPD, former smoker, coronary artery disease. EXAM: CHEST  2 VIEW COMPARISON:  CT scan of the chest of November 28, 2016 and chest x-ray of February 13, 2014. FINDINGS: The lungs are mildly hyperinflated with hemidiaphragm flattening. The interstitial markings are increased and more conspicuous than in the past. There is no alveolar infiltrate. There is no pleural effusion or pneumothorax. The heart is normal in size. The pulmonary vascularity is not engorged. There is calcification in the wall of the aortic arch. The mediastinum is normal in width. The bony thorax exhibits no acute abnormality. IMPRESSION: COPD with increased pulmonary interstitial markings more conspicuous than on the previous study. This may reflect acute infectious or inflammatory process ease. There is no alveolar pneumonia nor pulmonary edema. Thoracic aortic atherosclerosis. Electronically Signed   By: David  Swazil[place] M.D.   On: 05/02/2017 13:27    Results d/w pt.  Will send script for doxycycline 100 mg bid for 7 days.  She will need f/u CXR at next visit.  If findings persist, then she will need CT chest.

## 2017-05-09 DIAGNOSIS — R0689 Other abnormalities of breathing: Secondary | ICD-10-CM | POA: Diagnosis not present

## 2017-05-09 DIAGNOSIS — R609 Edema, unspecified: Secondary | ICD-10-CM | POA: Diagnosis not present

## 2017-05-09 DIAGNOSIS — G4733 Obstructive sleep apnea (adult) (pediatric): Secondary | ICD-10-CM | POA: Diagnosis not present

## 2017-05-10 ENCOUNTER — Encounter: Payer: Self-pay | Admitting: Pulmonary Disease

## 2017-05-10 DIAGNOSIS — G4733 Obstructive sleep apnea (adult) (pediatric): Secondary | ICD-10-CM | POA: Diagnosis not present

## 2017-05-10 DIAGNOSIS — R609 Edema, unspecified: Secondary | ICD-10-CM | POA: Diagnosis not present

## 2017-05-10 DIAGNOSIS — R0689 Other abnormalities of breathing: Secondary | ICD-10-CM | POA: Diagnosis not present

## 2017-05-17 ENCOUNTER — Ambulatory Visit (HOSPITAL_COMMUNITY): Payer: Medicare Other | Attending: Internal Medicine

## 2017-05-17 ENCOUNTER — Other Ambulatory Visit: Payer: Self-pay

## 2017-05-17 DIAGNOSIS — G4733 Obstructive sleep apnea (adult) (pediatric): Secondary | ICD-10-CM | POA: Diagnosis not present

## 2017-05-17 DIAGNOSIS — R0600 Dyspnea, unspecified: Secondary | ICD-10-CM

## 2017-05-17 DIAGNOSIS — E785 Hyperlipidemia, unspecified: Secondary | ICD-10-CM | POA: Insufficient documentation

## 2017-05-17 DIAGNOSIS — E119 Type 2 diabetes mellitus without complications: Secondary | ICD-10-CM | POA: Insufficient documentation

## 2017-05-17 DIAGNOSIS — E669 Obesity, unspecified: Secondary | ICD-10-CM | POA: Insufficient documentation

## 2017-05-17 DIAGNOSIS — I10 Essential (primary) hypertension: Secondary | ICD-10-CM | POA: Diagnosis not present

## 2017-05-17 DIAGNOSIS — Z6841 Body Mass Index (BMI) 40.0 and over, adult: Secondary | ICD-10-CM | POA: Insufficient documentation

## 2017-05-17 DIAGNOSIS — R0609 Other forms of dyspnea: Secondary | ICD-10-CM | POA: Diagnosis not present

## 2017-05-22 ENCOUNTER — Telehealth: Payer: Self-pay | Admitting: Pulmonary Disease

## 2017-05-22 DIAGNOSIS — E782 Mixed hyperlipidemia: Secondary | ICD-10-CM | POA: Diagnosis not present

## 2017-05-22 DIAGNOSIS — E119 Type 2 diabetes mellitus without complications: Secondary | ICD-10-CM | POA: Diagnosis not present

## 2017-05-22 NOTE — Telephone Encounter (Signed)
Echo 05/17/17 >> mod LVH, EF 60 to 65%, grade 1 DD   Will have my nurse inform pt that Echo was okay.  [facility].

## 2017-05-23 ENCOUNTER — Telehealth: Payer: Self-pay | Admitting: Pulmonary Disease

## 2017-05-23 NOTE — Telephone Encounter (Signed)
ONO with Bipap 05/10/17 >> test time 7 hrs 35 min.  Average SpO2 92%, low SpO2 87%.  Spent 6 mins with SpO2 < 88%.  Bipap 04/09/17 to 05/08/17 >> used on 30 of 30 nights with average 8 hrs 11 min.  Average AHI 1.2 with Bipap 12/8 cm H2O.   Will have my nurse inform pt that Bipap reports shows good control of sleep apnea and oxygen level at night.

## 2017-05-24 NOTE — Telephone Encounter (Signed)
Results have been explained to patient, pt expressed understanding. Nothing further needed.  

## 2017-05-29 DIAGNOSIS — I10 Essential (primary) hypertension: Secondary | ICD-10-CM | POA: Diagnosis not present

## 2017-05-29 DIAGNOSIS — E119 Type 2 diabetes mellitus without complications: Secondary | ICD-10-CM | POA: Diagnosis not present

## 2017-05-29 DIAGNOSIS — Z0000 Encounter for general adult medical examination without abnormal findings: Secondary | ICD-10-CM | POA: Diagnosis not present

## 2017-05-29 DIAGNOSIS — E782 Mixed hyperlipidemia: Secondary | ICD-10-CM | POA: Diagnosis not present

## 2017-05-29 DIAGNOSIS — K861 Other chronic pancreatitis: Secondary | ICD-10-CM | POA: Diagnosis not present

## 2017-05-30 ENCOUNTER — Telehealth: Payer: Self-pay | Admitting: Pulmonary Disease

## 2017-05-30 NOTE — Telephone Encounter (Signed)
Spoke with patient and asked her to bring Bipap card so we can get a download at 05/31/17 at 3pm appointment. Pt agreed to bring it. Nothing further is needed.

## 2017-05-31 ENCOUNTER — Ambulatory Visit (INDEPENDENT_AMBULATORY_CARE_PROVIDER_SITE_OTHER): Payer: Medicare Other | Admitting: Adult Health

## 2017-05-31 ENCOUNTER — Ambulatory Visit (INDEPENDENT_AMBULATORY_CARE_PROVIDER_SITE_OTHER): Payer: Medicare Other | Admitting: Pulmonary Disease

## 2017-05-31 ENCOUNTER — Encounter: Payer: Self-pay | Admitting: Adult Health

## 2017-05-31 DIAGNOSIS — R0609 Other forms of dyspnea: Secondary | ICD-10-CM | POA: Diagnosis not present

## 2017-05-31 DIAGNOSIS — G4733 Obstructive sleep apnea (adult) (pediatric): Secondary | ICD-10-CM

## 2017-05-31 DIAGNOSIS — R0600 Dyspnea, unspecified: Secondary | ICD-10-CM

## 2017-05-31 LAB — PULMONARY FUNCTION TEST
DL/VA % pred: 91 %
DL/VA: 3.9 ml/min/mmHg/L
DLCO cor % pred: 95 %
DLCO cor: 18.31 ml/min/mmHg
DLCO unc % pred: 92 %
DLCO unc: 17.72 ml/min/mmHg
FEF 25-75 Post: 1.46 L/sec
FEF 25-75 Pre: 1.45 L/sec
FEF2575-%Change-Post: 0 %
FEF2575-%Pred-Post: 105 %
FEF2575-%Pred-Pre: 105 %
FEV1-%Change-Post: 0 %
FEV1-%Pred-Post: 108 %
FEV1-%Pred-Pre: 107 %
FEV1-Post: 1.86 L
FEV1-Pre: 1.85 L
FEV1FVC-%Change-Post: 0 %
FEV1FVC-%Pred-Pre: 102 %
FEV6-%Change-Post: 0 %
FEV6-%Pred-Post: 110 %
FEV6-%Pred-Pre: 110 %
FEV6-Post: 2.42 L
FEV6-Pre: 2.42 L
FEV6FVC-%Change-Post: 0 %
FEV6FVC-%Pred-Post: 105 %
FEV6FVC-%Pred-Pre: 106 %
FVC-%Change-Post: 0 %
FVC-%Pred-Post: 105 %
FVC-%Pred-Pre: 104 %
FVC-Post: 2.43 L
FVC-Pre: 2.42 L
Post FEV1/FVC ratio: 77 %
Post FEV6/FVC ratio: 100 %
Pre FEV1/FVC ratio: 77 %
Pre FEV6/FVC Ratio: 100 %
RV % pred: 136 %
RV: 2.91 L
TLC % pred: 125 %
TLC: 5.61 L

## 2017-05-31 NOTE — Progress Notes (Signed)
 @Patient  ID: Anne Juarez, female    DOB: Sep 06, 1941, 76 y.o.   MRN: [id number]  Chief Complaint  Patient presents with  . Follow-up    Dyspnea     Referring provider: Georgianne Fickamachandran, Ajith, MD  HPI: 76 yo female former smoker followed for chronic bronchitis, lung nodule and OSA/OHS on BIPAP   TEST  Pulmonary tests PFT 04/23/13 >>FEV1 2.0 (109%), FEV1% 79, TLC 4.48 (109%), DLCO 95%  CT chest 05/14/13 >> mild centrilobular emphysema, coronary calcifications, 6 mm RML nodule FeNO 11/21/16 >> 30 Spirometry 12/105/17 >> FEV1 1.71 (91%), FEV1% 74 CT chest 11/28/16 >> no change 4 mm RML and RLL nodules, atherosclerosis  Sleep tests ONO with RA 04/27/13 >>Test time 9 hrs 28 min. Baseline SpO2 97%, low SpO2 54%. Spent 166 min with SpO2 <88% PSG 08/04/13 >> AHI 59.6, SpO2 low 83%, PLMI 0. BiPAP 10/22/13 >> BiPAP 12/8 cm H2O >> AHI 1.3, +R. PLMI 42. ONO with BiPAP 03/26/14 >> Test tim 5 hrs 49 min. Mean SpO2 93%, low SpO2 85%. Spent 2 min with SpO2 < 89%. BiPAP 07/17/14 to 10/14/14 >> used on 89 of 90 nights with average 6 hrs 45 minutes. Average AHI 1.1 with BiPAP 12/8 cm H2O.  05/31/2017 Follow up : Chronic bronchitis /Lung nodule /OSA Patient presents for a one-month follow-up. Patient was seen last visit and had noticed some increased shortness of breath with activity. She was set up for a pulmonary function test that was done today that showed normal lung function with an FEV1 at 108%, ratio 77, FVC 105%, DLCO 92%, no significant bronchodilator response . No significant changes since 2014 .  Chest x-ray showed COPD and increased interstitial markings.Marland Kitchen. CBC was normal 2-D echo showed an EF of 60-d65%, moderate LVH, grade 1 diastolic dysfunction.. She says she is feeling better. Had recent bronchitis , tx w/ Doxcycline really helped.  Cough is better. Decreased dyspnea./doe.   Remains on BIPAP At bedtime  . ONO was done on BIPAP that showed no sign desats.  Download showed good control  and usage with AHI 1.2. Feels rested . Discussed wt loss.    Allergies  Allergen Reactions  . Codeine Itching    Immunization History  Administered Date(s) Administered  . Influenza Split 10/18/2012, 09/22/2013  . Influenza,inj,Quad PF,36+ Mos 10/06/2014, 10/31/2016  . Pneumococcal Conjugate-13 10/12/2014  . Pneumococcal Polysaccharide-23 12/18/2010    Past Medical History:  Diagnosis Date  . Anxiety   . Depression   . Diabetes mellitus without complication (HCC)   . Hyperlipidemia   . Hypertension   . OSA (obstructive sleep apnea) 07/01/2013  . Osteoarthritis (arthritis due to wear and tear of joints)    neck and knees,pelvic bone and left shoulder    Tobacco History: History  Smoking Status  . Former Smoker  . Packs/day: 2.50  . Years: 45.00  . Types: Cigarettes  . Quit date: 03/18/2002  Smokeless Tobacco  . Never Used   Counseling given: Not Answered   Outpatient Encounter Prescriptions as of 05/31/2017  Medication Sig  . albuterol (PROAIR HFA) 108 (90 BASE) MCG/ACT inhaler Inhale 2 puffs into the lungs every 6 (six) hours as needed for wheezing.  Marland Kitchen. amLODipine (NORVASC) 5 MG tablet Take 5 mg by mouth daily.  Marland Kitchen. atorvastatin (LIPITOR) 40 MG tablet Take 40 mg by mouth daily.  . Cholecalciferol (VITAMIN D-3) 1000 UNITS CAPS Take by mouth daily.  . cyanocobalamin 1000 MCG tablet Take 100 mcg by mouth daily.  . cyclobenzaprine (FLEXERIL)  10 MG tablet Take 10 mg by mouth 3 (three) times daily as needed.   . fish oil-omega-3 fatty acids 1000 MG capsule Take 2 g by mouth daily.  . Fluticasone-Salmeterol (ADVAIR) 100-50 MCG/DOSE AEPB Inhale 1 puff into the lungs 2 (two) times daily.  . furosemide (LASIX) 20 MG tablet Daily for 3 days, then as needed for weight gain associated with leg swelling  . Multiple Vitamins-Minerals (CENTRUM SILVER PO) Take by mouth daily.  . potassium chloride (K-DUR) 10 MEQ tablet Daily for 3 days, then as needed when taking lasix  . Potassium  Gluconate 2 MEQ TABS Take by mouth daily.  Marland Kitchen pyridoxine (B-6) 100 MG tablet Take 100 mg by mouth daily.  Marland Kitchen telmisartan-hydrochlorothiazide (MICARDIS HCT) 80-25 MG per tablet Take 1 tablet by mouth daily.  . Turmeric Curcumin 500 MG CAPS Take by mouth. Take 2 daily  . vitamin C (ASCORBIC ACID) 500 MG tablet Take 500 mg by mouth daily.  . vitamin E 400 UNIT capsule Take 400 Units by mouth daily.  . [DISCONTINUED] doxycycline (VIBRA-TABS) 100 MG tablet Take 1 tablet (100 mg total) by mouth 2 (two) times daily. (Patient not taking: Reported on 05/31/2017)   No facility-administered encounter medications on file as of 05/31/2017.      Review of Systems  Constitutional:   No  weight loss, night sweats,  Fevers, chills, fatigue, or  lassitude.  HEENT:   No headaches,  Difficulty swallowing,  Tooth/dental problems, or  Sore throat,                No sneezing, itching, ear ache, nasal congestion, post nasal drip,   CV:  No chest pain,  Orthopnea, PND, swelling in lower extremities, anasarca, dizziness, palpitations, syncope.   GI  No heartburn, indigestion, abdominal pain, nausea, vomiting, diarrhea, change in bowel habits, loss of appetite, bloody stools.   Resp: No shortness of breath with exertion or at rest.  No excess mucus, no productive cough,  No non-productive cough,  No coughing up of blood.  No change in color of mucus.  No wheezing.  No chest wall deformity  Skin: no rash or lesions.  GU: no dysuria, change in color of urine, no urgency or frequency.  No flank pain, no hematuria   MS:  No joint pain or swelling.  No decreased range of motion.  No back pain.    Physical Exam  BP 124/68 (BP Location: Left Arm, Cuff Size: Normal)   Pulse 75   Ht 5' 0.3" (1.532 m)   Wt 231 lb 3.2 oz (104.9 kg)   SpO2 97%   BMI 44.70 kg/m   GEN: A/Ox3; pleasant , NAD, obese    HEENT:  [place]/AT,  EACs-clear, TMs-wnl, NOSE-clear, THROAT-clear, no lesions, no postnasal drip or exudate noted. Class  2 MP airway   NECK:  Supple w/ fair ROM; no JVD; normal carotid impulses w/o bruits; no thyromegaly or nodules palpated; no lymphadenopathy.    RESP  Clear  P & A; w/o, wheezes/ rales/ or rhonchi. no accessory muscle use, no dullness to percussion  CARD:  RRR, no m/r/g, no peripheral edema, pulses intact, no cyanosis or clubbing.  GI:   Soft & nt; nml bowel sounds; no organomegaly or masses detected.   Musco: Warm bil, no deformities or joint swelling noted.   Neuro: alert, no focal deficits noted.    Skin: Warm, no lesions or rashes    Lab Results:  CBC    Component Value Date/Time  WBC 9.6 05/02/2017 1253   RBC 4.56 05/02/2017 1253   HGB 13.7 05/02/2017 1253   HCT 40.4 05/02/2017 1253   PLT 267.0 05/02/2017 1253   MCV 88.6 05/02/2017 1253   MCHC 33.8 05/02/2017 1253   RDW 15.5 05/02/2017 1253   LYMPHSABS 3.1 05/02/2017 1253   MONOABS 0.9 05/02/2017 1253   EOSABS 0.2 05/02/2017 1253   BASOSABS 0.1 05/02/2017 1253    BMET    Component Value Date/Time   NA 136 05/02/2017 1253   K 3.7 05/02/2017 1253   CL 103 05/02/2017 1253   CO2 27 05/02/2017 1253   GLUCOSE 119 (H) 05/02/2017 1253   BUN 10 05/02/2017 1253   CREATININE 0.79 05/02/2017 1253   CALCIUM 9.7 05/02/2017 1253    BNP No results found for: BNP  ProBNP    Component Value Date/Time   PROBNP 37.0 02/13/2014 1104    Imaging: Dg Chest 2 View  Result Date: 05/02/2017 CLINICAL DATA:  Chronic dyspnea with increasing symptoms. History of COPD, former smoker, coronary artery disease. EXAM: CHEST  2 VIEW COMPARISON:  CT scan of the chest of November 28, 2016 and chest x-ray of February 13, 2014. FINDINGS: The lungs are mildly hyperinflated with hemidiaphragm flattening. The interstitial markings are increased and more conspicuous than in the past. There is no alveolar infiltrate. There is no pleural effusion or pneumothorax. The heart is normal in size. The pulmonary vascularity is not engorged. There is  calcification in the wall of the aortic arch. The mediastinum is normal in width. The bony thorax exhibits no acute abnormality. IMPRESSION: COPD with increased pulmonary interstitial markings more conspicuous than on the previous study. This may reflect acute infectious or inflammatory process ease. There is no alveolar pneumonia nor pulmonary edema. Thoracic aortic atherosclerosis. Electronically Signed   By: David  [place] M.D.   On: 05/02/2017 13:27     Assessment & Plan:   Dyspnea Improved after bronchitis treatment .  Workup unrevealing w/ Echo w/ nml EF. Gr 1 DD .  No evidence of volume overload on exam  PFT was normal . CXR w/ no acute process.  O2 sats good and excellent control usage of BIPAP w/ no sign desats.   Plan  Patient Instructions  Continue on Advair 100 1 puff Twice daily  , brush/rinse /gargle after use.  Continue on BIPAP At bedtime   Follow up Dr. Craige Cotta  In 4-6  months and As needed       OSA (obstructive sleep apnea) Cont on BIPAP .  Wt loss      Rubye Oaks, NP 05/31/2017

## 2017-05-31 NOTE — Patient Instructions (Signed)
Continue on Advair 100 1 puff Twice daily  , brush/rinse /gargle after use.  Continue on BIPAP At bedtime   Follow up Dr. Craige CottaSood  In 4-6  months and As needed

## 2017-05-31 NOTE — Progress Notes (Signed)
PFT done today. 

## 2017-05-31 NOTE — Assessment & Plan Note (Signed)
Improved after bronchitis treatment .  Workup unrevealing w/ Echo w/ nml EF. Gr 1 DD .  No evidence of volume overload on exam  PFT was normal . CXR w/ no acute process.  O2 sats good and excellent control usage of BIPAP w/ no sign desats.   Plan  Patient Instructions  Continue on Advair 100 1 puff Twice daily  , brush/rinse /gargle after use.  Continue on BIPAP At bedtime   Follow up Dr. Craige CottaSood  In 4-6  months and As needed

## 2017-05-31 NOTE — Assessment & Plan Note (Addendum)
Cont on BIPAP At bedtime   Wt loss

## 2017-08-02 ENCOUNTER — Ambulatory Visit: Payer: Medicare Other | Admitting: Gastroenterology

## 2017-10-03 DIAGNOSIS — Z23 Encounter for immunization: Secondary | ICD-10-CM | POA: Diagnosis not present

## 2017-10-25 ENCOUNTER — Ambulatory Visit: Payer: Medicare Other | Admitting: Pulmonary Disease

## 2017-12-25 DIAGNOSIS — E119 Type 2 diabetes mellitus without complications: Secondary | ICD-10-CM | POA: Diagnosis not present

## 2017-12-25 DIAGNOSIS — Z0000 Encounter for general adult medical examination without abnormal findings: Secondary | ICD-10-CM | POA: Diagnosis not present

## 2017-12-25 DIAGNOSIS — I10 Essential (primary) hypertension: Secondary | ICD-10-CM | POA: Diagnosis not present

## 2017-12-25 DIAGNOSIS — E782 Mixed hyperlipidemia: Secondary | ICD-10-CM | POA: Diagnosis not present

## 2017-12-25 DIAGNOSIS — R1310 Dysphagia, unspecified: Secondary | ICD-10-CM | POA: Diagnosis not present

## 2017-12-25 DIAGNOSIS — F5101 Primary insomnia: Secondary | ICD-10-CM | POA: Diagnosis not present

## 2017-12-26 ENCOUNTER — Other Ambulatory Visit: Payer: Self-pay | Admitting: Internal Medicine

## 2017-12-26 DIAGNOSIS — R1310 Dysphagia, unspecified: Secondary | ICD-10-CM

## 2017-12-26 DIAGNOSIS — R1319 Other dysphagia: Secondary | ICD-10-CM

## 2018-01-01 ENCOUNTER — Ambulatory Visit
Admission: RE | Admit: 2018-01-01 | Discharge: 2018-01-01 | Disposition: A | Discharge status: Home / Self Care | Payer: Medicare Other | Source: Ambulatory Visit | Attending: Internal Medicine | Admitting: Internal Medicine

## 2018-01-01 DIAGNOSIS — K861 Other chronic pancreatitis: Secondary | ICD-10-CM | POA: Diagnosis not present

## 2018-01-01 DIAGNOSIS — R1310 Dysphagia, unspecified: Secondary | ICD-10-CM

## 2018-01-01 DIAGNOSIS — K449 Diaphragmatic hernia without obstruction or gangrene: Secondary | ICD-10-CM | POA: Diagnosis not present

## 2018-01-01 DIAGNOSIS — R1319 Other dysphagia: Secondary | ICD-10-CM

## 2018-01-01 DIAGNOSIS — E119 Type 2 diabetes mellitus without complications: Secondary | ICD-10-CM | POA: Diagnosis not present

## 2018-01-01 DIAGNOSIS — I10 Essential (primary) hypertension: Secondary | ICD-10-CM | POA: Diagnosis not present

## 2018-01-01 DIAGNOSIS — E782 Mixed hyperlipidemia: Secondary | ICD-10-CM | POA: Diagnosis not present

## 2018-01-01 DIAGNOSIS — J449 Chronic obstructive pulmonary disease, unspecified: Secondary | ICD-10-CM | POA: Diagnosis not present

## 2018-01-01 DIAGNOSIS — M150 Primary generalized (osteo)arthritis: Secondary | ICD-10-CM | POA: Diagnosis not present

## 2018-02-20 DIAGNOSIS — R072 Precordial pain: Secondary | ICD-10-CM | POA: Diagnosis not present

## 2018-02-20 DIAGNOSIS — M546 Pain in thoracic spine: Secondary | ICD-10-CM | POA: Diagnosis not present

## 2018-02-20 DIAGNOSIS — R079 Chest pain, unspecified: Secondary | ICD-10-CM | POA: Diagnosis not present

## 2018-05-07 DIAGNOSIS — E782 Mixed hyperlipidemia: Secondary | ICD-10-CM | POA: Diagnosis not present

## 2018-05-07 DIAGNOSIS — I10 Essential (primary) hypertension: Secondary | ICD-10-CM | POA: Diagnosis not present

## 2018-05-07 DIAGNOSIS — K861 Other chronic pancreatitis: Secondary | ICD-10-CM | POA: Diagnosis not present

## 2018-05-07 DIAGNOSIS — E119 Type 2 diabetes mellitus without complications: Secondary | ICD-10-CM | POA: Diagnosis not present

## 2018-05-08 DIAGNOSIS — Z961 Presence of intraocular lens: Secondary | ICD-10-CM | POA: Diagnosis not present

## 2018-05-08 DIAGNOSIS — H53001 Unspecified amblyopia, right eye: Secondary | ICD-10-CM | POA: Diagnosis not present

## 2018-05-08 DIAGNOSIS — H2512 Age-related nuclear cataract, left eye: Secondary | ICD-10-CM | POA: Diagnosis not present

## 2018-05-08 DIAGNOSIS — H40013 Open angle with borderline findings, low risk, bilateral: Secondary | ICD-10-CM | POA: Diagnosis not present

## 2018-05-14 DIAGNOSIS — I10 Essential (primary) hypertension: Secondary | ICD-10-CM | POA: Diagnosis not present

## 2018-05-14 DIAGNOSIS — J449 Chronic obstructive pulmonary disease, unspecified: Secondary | ICD-10-CM | POA: Diagnosis not present

## 2018-05-14 DIAGNOSIS — E118 Type 2 diabetes mellitus with unspecified complications: Secondary | ICD-10-CM | POA: Diagnosis not present

## 2018-05-14 DIAGNOSIS — E782 Mixed hyperlipidemia: Secondary | ICD-10-CM | POA: Diagnosis not present

## 2018-05-14 DIAGNOSIS — F322 Major depressive disorder, single episode, severe without psychotic features: Secondary | ICD-10-CM | POA: Diagnosis not present

## 2018-06-25 DIAGNOSIS — F322 Major depressive disorder, single episode, severe without psychotic features: Secondary | ICD-10-CM | POA: Diagnosis not present

## 2018-07-18 DIAGNOSIS — R197 Diarrhea, unspecified: Secondary | ICD-10-CM | POA: Diagnosis not present

## 2018-07-18 DIAGNOSIS — Q2733 Arteriovenous malformation of digestive system vessel: Secondary | ICD-10-CM | POA: Diagnosis not present

## 2018-07-18 DIAGNOSIS — K5790 Diverticulosis of intestine, part unspecified, without perforation or abscess without bleeding: Secondary | ICD-10-CM | POA: Diagnosis not present

## 2018-07-18 DIAGNOSIS — K861 Other chronic pancreatitis: Secondary | ICD-10-CM | POA: Diagnosis not present

## 2018-07-18 DIAGNOSIS — E119 Type 2 diabetes mellitus without complications: Secondary | ICD-10-CM | POA: Diagnosis not present

## 2018-07-18 DIAGNOSIS — J449 Chronic obstructive pulmonary disease, unspecified: Secondary | ICD-10-CM | POA: Diagnosis not present

## 2018-07-18 DIAGNOSIS — R5383 Other fatigue: Secondary | ICD-10-CM | POA: Diagnosis not present

## 2018-10-02 DIAGNOSIS — E119 Type 2 diabetes mellitus without complications: Secondary | ICD-10-CM | POA: Diagnosis not present

## 2018-10-02 DIAGNOSIS — E782 Mixed hyperlipidemia: Secondary | ICD-10-CM | POA: Diagnosis not present

## 2018-10-09 DIAGNOSIS — F322 Major depressive disorder, single episode, severe without psychotic features: Secondary | ICD-10-CM | POA: Diagnosis not present

## 2018-10-09 DIAGNOSIS — I10 Essential (primary) hypertension: Secondary | ICD-10-CM | POA: Diagnosis not present

## 2018-10-09 DIAGNOSIS — Z23 Encounter for immunization: Secondary | ICD-10-CM | POA: Diagnosis not present

## 2018-10-09 DIAGNOSIS — E782 Mixed hyperlipidemia: Secondary | ICD-10-CM | POA: Diagnosis not present

## 2018-10-09 DIAGNOSIS — E118 Type 2 diabetes mellitus with unspecified complications: Secondary | ICD-10-CM | POA: Diagnosis not present

## 2018-11-04 ENCOUNTER — Telehealth: Payer: Self-pay | Admitting: *Deleted

## 2018-11-04 NOTE — Telephone Encounter (Signed)
ATC patient she answers the phone and quickly states "stop calling me" and hangs up. Patient needs to bring in her chip for a CPAP machine. Will address at appointment in office tomorrow.

## 2018-11-05 ENCOUNTER — Ambulatory Visit: Payer: PPO | Admitting: Pulmonary Disease

## 2018-11-05 NOTE — Progress Notes (Deleted)
 @Patient  ID: Anne Juarez, female    DOB: 04/22/1941, 77 y.o.   MRN: [id number]  No chief complaint on file.   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  112 pack years.  Quit smoking in 2003. Maintenance: Advair 100 Pt of: Dr. Craige CottaSood  Recent [facility] Encounters:   Last seen 2018 by TP  11/05/2018  - Visit   HPI  Tests:  Pulmonary tests PFT 04/23/13 >>FEV1 2.0 (109%), FEV1% 79, TLC 4.48 (109%), DLCO 95%  CT chest 05/14/13 >> mild centrilobular emphysema, coronary calcifications, 6 mm RML nodule FeNO 11/21/16 >> 30 Spirometry 12/105/17 >> FEV1 1.71 (91%), FEV1% 74 11/28/2016-CT chest without contrast-stable 4 mm pulmonary nodules in right middle and lower lobes consistent with benign etiology.  No further imaging follow-up is required.   Sleep tests ONO with RA 04/27/13 >>Test time 9 hrs 28 min. Baseline SpO2 97%, low SpO2 54%. Spent 166 min with SpO2 <88% PSG 08/04/13 >> AHI 59.6, SpO2 low 83%, PLMI 0. BiPAP 10/22/13 >> BiPAP 12/8 cm H2O >> AHI 1.3, +R. PLMI 42. ONO with BiPAP 03/26/14 >> Test tim 5 hrs 49 min. Mean SpO2 93%, low SpO2 85%. Spent 2 min with SpO2 < 89%. BiPAP 07/17/14 to 10/14/14 >> used on 89 of 90 nights with average 6 hrs 45 minutes. Average AHI 1.1 with BiPAP 12/8 cm H2O.  05/17/2017-echocardiogram-LV ejection fraction 60 to 65%, grade 1 diastolic dysfunction, moderate LVH  FENO:  Lab Results  Component Value Date   NITRICOXIDE 30 11/21/2016    PFT: PFT Results Latest Ref Rng & Units 05/31/2017 04/23/2013  FVC-Pre L 2.42 -  FVC-Predicted Pre % 104 100  FVC-Post L 2.43 2.54  FVC-Predicted Post % 105  104  Pre FEV1/FVC % % 77 76  Post FEV1/FCV % % 77 79  FEV1-Pre L 1.85 1.86  FEV1-Predicted Pre % 107 101  FEV1-Post L 1.86 2.00  DLCO UNC% % 92 95  DLCO COR %Predicted % 91 92  TLC L 5.61 4.88  TLC % Predicted % 125 109  RV % Predicted % 136 128    Imaging: No results found.  Chart Review:    Specialty Problems      Pulmonary Problems   Chronic cough   Dyspnea   Pulmonary emphysema (HCC)   Pulmonary nodule, right   OSA (obstructive sleep apnea)    Bipap 04/09/17 to 05/08/17 >> used on 30 of 30 nights with average 8 hrs 11 min.  Average AHI 1.2 with Bipap 12/8 cm H2O.         Allergies  Allergen Reactions  . Codeine Itching    Immunization History  Administered Date(s) Administered  . Influenza Split 10/18/2012, 09/22/2013  . Influenza,inj,Quad PF,6+ Mos 10/06/2014, 10/31/2016  . Pneumococcal Conjugate-13 10/12/2014  . Pneumococcal Polysaccharide-23 12/18/2010    Past Medical History:  Diagnosis Date  . Anxiety   . Depression   . Diabetes mellitus without complication (HCC)   . Hyperlipidemia   . Hypertension   . OSA (obstructive sleep apnea) 07/01/2013  . Osteoarthritis (arthritis due to wear and tear of joints)    neck and knees,pelvic bone and left shoulder    Tobacco History: Social History   Tobacco Use  Smoking Status  Former Smoker  . Packs/day: 2.50  . Years: 45.00  . Pack years: 112.50  . Types: Cigarettes  . Last attempt to quit: 03/18/2002  . Years since quitting: 16.6  Smokeless Tobacco Never Used   Counseling given: Not Answered  Continue to not smoke  Outpatient Encounter Medications as of 11/05/2018  Medication Sig  . albuterol (PROAIR HFA) 108 (90 BASE) MCG/ACT inhaler Inhale 2 puffs into the lungs every 6 (six) hours as needed for wheezing.  Marland Kitchen amLODipine (NORVASC) 5 MG tablet Take 5 mg by mouth daily.  Marland Kitchen atorvastatin (LIPITOR) 40 MG tablet Take 40 mg by mouth daily.  . Cholecalciferol (VITAMIN D-3) 1000 UNITS CAPS Take by mouth  daily.  . cyanocobalamin 1000 MCG tablet Take 100 mcg by mouth daily.  . cyclobenzaprine (FLEXERIL) 10 MG tablet Take 10 mg by mouth 3 (three) times daily as needed.   . fish oil-omega-3 fatty acids 1000 MG capsule Take 2 g by mouth daily.  . Fluticasone-Salmeterol (ADVAIR) 100-50 MCG/DOSE AEPB Inhale 1 puff into the lungs 2 (two) times daily.  . furosemide (LASIX) 20 MG tablet Daily for 3 days, then as needed for weight gain associated with leg swelling  . Multiple Vitamins-Minerals (CENTRUM SILVER PO) Take by mouth daily.  . potassium chloride (K-DUR) 10 MEQ tablet Daily for 3 days, then as needed when taking lasix  . Potassium Gluconate 2 MEQ TABS Take by mouth daily.  Marland Kitchen pyridoxine (B-6) 100 MG tablet Take 100 mg by mouth daily.  Marland Kitchen telmisartan-hydrochlorothiazide (MICARDIS HCT) 80-25 MG per tablet Take 1 tablet by mouth daily.  . Turmeric Curcumin 500 MG CAPS Take by mouth. Take 2 daily  . vitamin C (ASCORBIC ACID) 500 MG tablet Take 500 mg by mouth daily.  . vitamin E 400 UNIT capsule Take 400 Units by mouth daily.   No facility-administered encounter medications on file as of 11/05/2018.      Review of Systems  Review of Systems   Physical Exam  There were no vitals taken for this visit.  Wt Readings from Last 5 Encounters:  05/31/17 231 lb 3.2 oz (104.9 kg)  05/02/17 233 lb (105.7 kg)  01/02/17 234 lb 9.6 oz (106.4 kg)  11/21/16 230 lb 3.2 oz (104.4 kg)  11/30/14 233 lb (105.7 kg)     Physical Exam    Lab Results:  CBC    Component Value Date/Time   WBC 9.6 05/02/2017 1253   RBC 4.56 05/02/2017 1253   HGB 13.7 05/02/2017 1253   HCT 40.4 05/02/2017 1253   PLT 267.0 05/02/2017 1253   MCV 88.6 05/02/2017 1253   MCHC 33.8 05/02/2017 1253   RDW 15.5 05/02/2017 1253   LYMPHSABS 3.1 05/02/2017 1253   MONOABS 0.9 05/02/2017 1253   EOSABS 0.2 05/02/2017 1253   BASOSABS 0.1 05/02/2017 1253    BMET    Component Value Date/Time   NA 136 05/02/2017 1253   K 3.7  05/02/2017 1253   CL 103 05/02/2017 1253   CO2 27 05/02/2017 1253   GLUCOSE 119 (H) 05/02/2017 1253   BUN 10 05/02/2017 1253   CREATININE 0.79 05/02/2017 1253   CALCIUM 9.7 05/02/2017 1253    BNP No results found for: BNP  ProBNP    Component Value Date/Time   PROBNP 37.0 02/13/2014 1104      Assessment & Plan:     No problem-specific Assessment & Plan notes found for this encounter.     Coral Ceo, NP 11/05/2018

## 2018-11-25 DIAGNOSIS — M797 Fibromyalgia: Secondary | ICD-10-CM | POA: Diagnosis not present

## 2018-11-25 DIAGNOSIS — E118 Type 2 diabetes mellitus with unspecified complications: Secondary | ICD-10-CM | POA: Diagnosis not present

## 2018-11-25 DIAGNOSIS — F322 Major depressive disorder, single episode, severe without psychotic features: Secondary | ICD-10-CM | POA: Diagnosis not present

## 2019-01-03 ENCOUNTER — Telehealth (HOSPITAL_COMMUNITY): Payer: Self-pay | Admitting: Psychiatry

## 2019-01-17 DIAGNOSIS — E118 Type 2 diabetes mellitus with unspecified complications: Secondary | ICD-10-CM | POA: Diagnosis not present

## 2019-01-17 DIAGNOSIS — I10 Essential (primary) hypertension: Secondary | ICD-10-CM | POA: Diagnosis not present

## 2019-01-17 DIAGNOSIS — E782 Mixed hyperlipidemia: Secondary | ICD-10-CM | POA: Diagnosis not present

## 2019-01-17 DIAGNOSIS — Z0000 Encounter for general adult medical examination without abnormal findings: Secondary | ICD-10-CM | POA: Diagnosis not present

## 2019-01-27 DIAGNOSIS — M545 Low back pain: Secondary | ICD-10-CM | POA: Diagnosis not present

## 2019-01-27 DIAGNOSIS — K861 Other chronic pancreatitis: Secondary | ICD-10-CM | POA: Diagnosis not present

## 2019-01-27 DIAGNOSIS — E782 Mixed hyperlipidemia: Secondary | ICD-10-CM | POA: Diagnosis not present

## 2019-01-27 DIAGNOSIS — Z0000 Encounter for general adult medical examination without abnormal findings: Secondary | ICD-10-CM | POA: Diagnosis not present

## 2019-01-27 DIAGNOSIS — I10 Essential (primary) hypertension: Secondary | ICD-10-CM | POA: Diagnosis not present

## 2019-01-27 DIAGNOSIS — E118 Type 2 diabetes mellitus with unspecified complications: Secondary | ICD-10-CM | POA: Diagnosis not present

## 2019-01-27 DIAGNOSIS — E1121 Type 2 diabetes mellitus with diabetic nephropathy: Secondary | ICD-10-CM | POA: Diagnosis not present

## 2019-01-27 DIAGNOSIS — M797 Fibromyalgia: Secondary | ICD-10-CM | POA: Diagnosis not present

## 2019-01-27 DIAGNOSIS — J449 Chronic obstructive pulmonary disease, unspecified: Secondary | ICD-10-CM | POA: Diagnosis not present

## 2019-01-27 DIAGNOSIS — F322 Major depressive disorder, single episode, severe without psychotic features: Secondary | ICD-10-CM | POA: Diagnosis not present

## 2019-02-24 DIAGNOSIS — F322 Major depressive disorder, single episode, severe without psychotic features: Secondary | ICD-10-CM | POA: Diagnosis not present

## 2019-09-12 DIAGNOSIS — G4733 Obstructive sleep apnea (adult) (pediatric): Secondary | ICD-10-CM | POA: Diagnosis not present

## 2020-03-04 ENCOUNTER — Other Ambulatory Visit: Payer: Self-pay | Admitting: *Deleted

## 2020-03-04 NOTE — Patient Outreach (Signed)
[facility]) Care Management Chronic Special Needs Program    03/04/2020  Name: Anne Juarez, DOB: 16-Jul-1941  MRN: [id number]   Anne Juarez is enrolled in a chronic special needs plan for diabetes.  Health risk assessment completed previously by client.  Interdisciplinary care plan created from information in electronic medical record.  Client also has history of hypertension and lung problems.  RN care manager will send introductory letter with interdisciplinary care plan to primary care provider and client, along with education materials.  Assigned RN care manager will follow up within 30 months.  Goals    . Client understands the importance of follow-up with providers by attending scheduled visits     Per medical record review client saw primary care provider 3 times in 2020 Please continue to follow up with your primary care doctor and schedule any needed appointments Pt reported on health risk assessment - lung problems RN care manager mailed to client EMMI education article "Chronic Obstructive Pulmonary Disease including emphysema"    . Client will verbalize knowledge of self management of Hypertension as evidences by BP reading of 140/90 or less; or as defined by provider     Take blood pressure medications as ordered Plan to follow a low salt diet Practice lifestyle modification- low sodium diet, weight control, exercise and stress relief Follow up with your provider  Monitor blood pressure and take readings to primary care provider appointments RN care manager mailed to client EMMI education article "Controlling Blood Pressure through lifestyle"    . HEMOGLOBIN A1C < 7.0     It is important to have your Hgb AIC checked every 6 months if you are at goal and every 3 months if you are not at goal. Please follow up with your primary care provider Check blood sugar as ordered by your provider Carbohydrate controlled meal planning Take medication as  prescribed Eye exam yearly Check feet daily Physical activity  RN care manager mailed EMMI education article to client "Diabetes and blood pressure"    . Maintain timely refills of diabetic medication as prescribed within the year .     Please take medications as prescribed by your doctor Refill medications timely and so you do not run out of medication    . Obtain annual  Lipid Profile, LDL-C     Lipid profile completed 01/17/19 LDL 78 The goal for LDL is less than 70 mg/dl as you are high risk for complications Try to avoid saturated fats, trans-fats and eat more fiber Follow up with primary care provider and have lab work as ordered    . Obtain Annual Eye (retinal)  Exam      Client reports in health risk assessment obtains eye exam once yearly Please schedule for your eye exam for this year if you have not done so It is important to have dilated eye exam every year    . Obtain Annual Foot Exam     Foot exam completed 01/27/19 Please schedule your foot exam for this year Have your doctor look at your feet at each visit    . Obtain annual screen for micro albuminuria (urine) , nephropathy (kidney problems)     Client reports on health risk assessment urine checked twice yearly Please schedule to have microalbuminuria checked  Follow up with your doctor for visit and lab work as ordered    . Obtain Hemoglobin A1C at least 2 times per year     Please have Hgb AIC checked  twice per year Please schedule with your doctor any needed appointments    . Visit Primary Care Provider or Endocrinologist at least 2 times per year      Per medical record review, client saw primary care provider 3 times in 2020 Please continue to follow up with your doctor and schedule any needed visit       Jacqlyn Larsen [facility], [place] Coordinator [phone]

## 2020-03-30 DIAGNOSIS — M791 Myalgia, unspecified site: Secondary | ICD-10-CM | POA: Diagnosis not present

## 2020-03-30 DIAGNOSIS — Z0000 Encounter for general adult medical examination without abnormal findings: Secondary | ICD-10-CM | POA: Diagnosis not present

## 2020-03-30 DIAGNOSIS — E119 Type 2 diabetes mellitus without complications: Secondary | ICD-10-CM | POA: Diagnosis not present

## 2020-03-30 DIAGNOSIS — R300 Dysuria: Secondary | ICD-10-CM | POA: Diagnosis not present

## 2020-03-30 DIAGNOSIS — I10 Essential (primary) hypertension: Secondary | ICD-10-CM | POA: Diagnosis not present

## 2020-03-30 DIAGNOSIS — N390 Urinary tract infection, site not specified: Secondary | ICD-10-CM | POA: Diagnosis not present

## 2020-04-01 ENCOUNTER — Other Ambulatory Visit: Payer: Self-pay | Admitting: *Deleted

## 2020-04-01 NOTE — Patient Outreach (Signed)
  [facility]) Care Management Chronic Special Needs Program    04/01/2020  Name: Anne Juarez, DOB: Mar 17, 1941  MRN: [id number]   [name] is enrolled in a chronic special needs plan for Diabetes. Initial outreach telephone call to client and line is busy, unable to leave a message.  PLAN Follow up with client within 1-2 weeks  Irving Shows [facility], BSN [facility] Care Coordinator [phone]

## 2020-04-09 ENCOUNTER — Other Ambulatory Visit: Payer: Self-pay | Admitting: *Deleted

## 2020-04-09 NOTE — Patient Outreach (Signed)
  [facility]) Care Management Chronic Special Needs Program    04/09/2020  Name: Anne Juarez, DOB: 17-Nov-1941  MRN: [id number]   [name] is enrolled in a chronic special needs plan for Diabetes.  RN care manager outreached client for initial telephonic assessment, no answer to telephone and no option to leave voicemail.  PLAN Outreach client next month (within 3-4 weeks)  Irving Shows [facility], BSN [facility] Coordinator [phone]

## 2020-04-13 ENCOUNTER — Other Ambulatory Visit: Payer: Self-pay | Admitting: *Deleted

## 2020-04-13 NOTE — Patient Outreach (Addendum)
  [facility]) Care Management Chronic Special Needs Program    04/13/2020  Name: Anne Juarez, DOB: 1941-03-28  MRN: [id number]   Ms. [name] is enrolled in a chronic special needs plan for Diabetes. Outreach call to client for initial telephone outreach/  3rd attempt, someone picked up the phone and immediately disconnected.  RN care manager has attempted 3 times to reach client, RN care manager mailed unsuccessful outreach letter to client's home.  PLAN Outreach client in 6-9 months  Irving Shows [facility], BSN [facility] Care Coordinator [phone]

## 2020-04-19 DIAGNOSIS — F322 Major depressive disorder, single episode, severe without psychotic features: Secondary | ICD-10-CM | POA: Diagnosis not present

## 2020-04-19 DIAGNOSIS — I10 Essential (primary) hypertension: Secondary | ICD-10-CM | POA: Diagnosis not present

## 2020-04-19 DIAGNOSIS — E782 Mixed hyperlipidemia: Secondary | ICD-10-CM | POA: Diagnosis not present

## 2020-04-19 DIAGNOSIS — E1121 Type 2 diabetes mellitus with diabetic nephropathy: Secondary | ICD-10-CM | POA: Diagnosis not present

## 2020-04-19 DIAGNOSIS — K861 Other chronic pancreatitis: Secondary | ICD-10-CM | POA: Diagnosis not present

## 2020-04-19 DIAGNOSIS — M797 Fibromyalgia: Secondary | ICD-10-CM | POA: Diagnosis not present

## 2020-04-19 DIAGNOSIS — E118 Type 2 diabetes mellitus with unspecified complications: Secondary | ICD-10-CM | POA: Diagnosis not present

## 2020-04-19 DIAGNOSIS — Z0000 Encounter for general adult medical examination without abnormal findings: Secondary | ICD-10-CM | POA: Diagnosis not present

## 2020-04-19 DIAGNOSIS — J449 Chronic obstructive pulmonary disease, unspecified: Secondary | ICD-10-CM | POA: Diagnosis not present

## 2020-05-03 ENCOUNTER — Ambulatory Visit: Payer: HMO | Admitting: *Deleted

## 2020-07-28 ENCOUNTER — Telehealth: Payer: Self-pay | Admitting: Pulmonary Disease

## 2020-07-28 DIAGNOSIS — G4733 Obstructive sleep apnea (adult) (pediatric): Secondary | ICD-10-CM

## 2020-07-28 NOTE — Telephone Encounter (Signed)
Patient's CPAP has stopped working, can we order another one? She has not been seen since 2018 but has a consult appointment with [facility] in October 2021. Please advise.  Attempted to call patient, got a busy signal.

## 2020-07-28 NOTE — Telephone Encounter (Signed)
My understanding, since she hasn't been seen in office in 3 yrs, she would need a face to face visit first before insurance would pay for new CPAP machine.  She can check with her DME if she is eligible for new CPAP now, and if so we can order it prior to her visit in October.  Otherwise she would need to want until October unless she can get an earlier appointment with one of the NPs.

## 2020-07-29 ENCOUNTER — Telehealth: Payer: Self-pay | Admitting: Pulmonary Disease

## 2020-07-29 NOTE — Telephone Encounter (Signed)
Routed to triage for order to be placed Anne Juarez

## 2020-07-29 NOTE — Telephone Encounter (Signed)
Spoke with the pt and notified of response per Dr. Craige Cotta. She states she has already spoken with DME and they will approve new BIPAP. I advised that they may not until she has the face to face but will go ahead and place order. Pt to keep appt with Dr Craige Cotta for Oct.

## 2020-07-29 NOTE — Telephone Encounter (Signed)
She is on BIPAP and I already placed order for this earlier today

## 2020-08-18 DIAGNOSIS — M546 Pain in thoracic spine: Secondary | ICD-10-CM | POA: Diagnosis not present

## 2020-08-18 DIAGNOSIS — N390 Urinary tract infection, site not specified: Secondary | ICD-10-CM | POA: Diagnosis not present

## 2020-08-18 DIAGNOSIS — R350 Frequency of micturition: Secondary | ICD-10-CM | POA: Diagnosis not present

## 2020-09-29 DIAGNOSIS — E118 Type 2 diabetes mellitus with unspecified complications: Secondary | ICD-10-CM | POA: Diagnosis not present

## 2020-09-29 DIAGNOSIS — I10 Essential (primary) hypertension: Secondary | ICD-10-CM | POA: Diagnosis not present

## 2020-09-29 DIAGNOSIS — E782 Mixed hyperlipidemia: Secondary | ICD-10-CM | POA: Diagnosis not present

## 2020-10-07 DIAGNOSIS — E119 Type 2 diabetes mellitus without complications: Secondary | ICD-10-CM | POA: Diagnosis not present

## 2020-10-07 DIAGNOSIS — E1121 Type 2 diabetes mellitus with diabetic nephropathy: Secondary | ICD-10-CM | POA: Diagnosis not present

## 2020-10-07 DIAGNOSIS — Z23 Encounter for immunization: Secondary | ICD-10-CM | POA: Diagnosis not present

## 2020-10-07 DIAGNOSIS — M546 Pain in thoracic spine: Secondary | ICD-10-CM | POA: Diagnosis not present

## 2020-10-07 DIAGNOSIS — F322 Major depressive disorder, single episode, severe without psychotic features: Secondary | ICD-10-CM | POA: Diagnosis not present

## 2020-10-07 DIAGNOSIS — M25561 Pain in right knee: Secondary | ICD-10-CM | POA: Diagnosis not present

## 2020-10-08 ENCOUNTER — Institutional Professional Consult (permissible substitution): Payer: HMO | Admitting: Pulmonary Disease

## 2020-10-10 ENCOUNTER — Telehealth: Payer: Self-pay | Admitting: Surgery

## 2020-10-10 ENCOUNTER — Emergency Department (HOSPITAL_COMMUNITY)
Admission: EM | Admit: 2020-10-10 | Discharge: 2020-10-10 | Disposition: A | Discharge status: Home / Self Care | Payer: HMO | Attending: Emergency Medicine | Admitting: Emergency Medicine

## 2020-10-10 ENCOUNTER — Emergency Department (HOSPITAL_COMMUNITY): Payer: HMO

## 2020-10-10 ENCOUNTER — Emergency Department (HOSPITAL_BASED_OUTPATIENT_CLINIC_OR_DEPARTMENT_OTHER): Payer: HMO

## 2020-10-10 ENCOUNTER — Encounter (HOSPITAL_COMMUNITY): Payer: Self-pay | Admitting: Emergency Medicine

## 2020-10-10 ENCOUNTER — Other Ambulatory Visit: Payer: Self-pay

## 2020-10-10 DIAGNOSIS — R609 Edema, unspecified: Secondary | ICD-10-CM | POA: Diagnosis not present

## 2020-10-10 DIAGNOSIS — Z87891 Personal history of nicotine dependence: Secondary | ICD-10-CM | POA: Diagnosis not present

## 2020-10-10 DIAGNOSIS — I509 Heart failure, unspecified: Secondary | ICD-10-CM | POA: Diagnosis not present

## 2020-10-10 DIAGNOSIS — M8588 Other specified disorders of bone density and structure, other site: Secondary | ICD-10-CM | POA: Diagnosis not present

## 2020-10-10 DIAGNOSIS — M7989 Other specified soft tissue disorders: Secondary | ICD-10-CM

## 2020-10-10 DIAGNOSIS — Z7951 Long term (current) use of inhaled steroids: Secondary | ICD-10-CM | POA: Diagnosis not present

## 2020-10-10 DIAGNOSIS — M79605 Pain in left leg: Secondary | ICD-10-CM | POA: Diagnosis not present

## 2020-10-10 DIAGNOSIS — Z79899 Other long term (current) drug therapy: Secondary | ICD-10-CM | POA: Insufficient documentation

## 2020-10-10 DIAGNOSIS — M79609 Pain in unspecified limb: Secondary | ICD-10-CM

## 2020-10-10 DIAGNOSIS — M1612 Unilateral primary osteoarthritis, left hip: Secondary | ICD-10-CM | POA: Diagnosis not present

## 2020-10-10 DIAGNOSIS — E119 Type 2 diabetes mellitus without complications: Secondary | ICD-10-CM | POA: Insufficient documentation

## 2020-10-10 DIAGNOSIS — M1712 Unilateral primary osteoarthritis, left knee: Secondary | ICD-10-CM | POA: Diagnosis not present

## 2020-10-10 DIAGNOSIS — M25562 Pain in left knee: Secondary | ICD-10-CM | POA: Diagnosis present

## 2020-10-10 DIAGNOSIS — R531 Weakness: Secondary | ICD-10-CM | POA: Diagnosis not present

## 2020-10-10 DIAGNOSIS — G4733 Obstructive sleep apnea (adult) (pediatric): Secondary | ICD-10-CM | POA: Diagnosis not present

## 2020-10-10 DIAGNOSIS — R0689 Other abnormalities of breathing: Secondary | ICD-10-CM | POA: Diagnosis not present

## 2020-10-10 DIAGNOSIS — I10 Essential (primary) hypertension: Secondary | ICD-10-CM | POA: Diagnosis not present

## 2020-10-10 MED ORDER — OXYCODONE-ACETAMINOPHEN 5-325 MG PO TABS
1.0000 | ORAL_TABLET | Freq: Once | ORAL | Status: AC
  Administered 2020-10-10: 1 via ORAL
  Filled 2020-10-10: qty 1

## 2020-10-10 MED ORDER — OXYCODONE-ACETAMINOPHEN 5-325 MG PO TABS: 1.0000 | ORAL_TABLET | Freq: Four times a day (QID) | ORAL | 0 refills | Status: DC | PRN

## 2020-10-10 NOTE — Progress Notes (Signed)
Left Lower Ext. study completed.   See CVProc for preliminary results.   Janelle Culton, RDMS, RVT 

## 2020-10-10 NOTE — ED Provider Notes (Signed)
[facility] EMERGENCY DEPARTMENT Provider Note   CSN: [id number] Arrival date & time: 10/10/20  1059     History Chief Complaint  Patient presents with  . Knee Pain    Anne Juarez is a 79 y.o. female.  The history is provided by the patient. No language interpreter was used.  Knee Pain Associated symptoms: no fever      79 year old female significant history of osteoarthritis, diabetes, hypertension who presented for evaluation of left knee pain.  Patient admits that 4 days ago she was perform some heavy lifting.  The next day she developed pain about her left knee.  She described pain as a sharp achy sensation worse when she ambulate.  States she is having a difficult time bearing weight as it makes her pain worse.  Pain does radiates towards her left thigh to her lower back.  Pain not adequately controlled despite taking Lyrica, and extra strength Tylenol at home.  She does not complain of any fever, bowel bladder incontinence or saddle anesthesia.  No rash or numbness.  She report having similar pain to her right knee but much milder compared to her left knee.  She denies any ankle pain or foot pain.  Past Medical History:  Diagnosis Date  . Anxiety   . Depression   . Diabetes mellitus without complication (HCC)   . Hyperlipidemia   . Hypertension   . OSA (obstructive sleep apnea) 07/01/2013  . Osteoarthritis (arthritis due to wear and tear of joints)    neck and knees,pelvic bone and left shoulder    Patient Active Problem List   Diagnosis Date Noted  . Peripheral edema 02/13/2014  . Coronary artery calcification 01/31/2014  . OSA (obstructive sleep apnea) 07/01/2013  . Pulmonary emphysema (HCC) 05/20/2013  . Pulmonary nodule, right 05/20/2013  . Dyspnea 04/25/2013  . Chronic cough 04/25/2013    Past Surgical History:  Procedure Laterality Date  . COLONOSCOPY    . LAPAROSCOPIC TUBAL LIGATION    . PILONIDAL CYST EXCISION    . POLYPECTOMY    .  TONSILLECTOMY       OB History   No obstetric history on file.     Family History  Problem Relation Age of Onset  . Colon cancer Sister   . Liver cancer Brother   . Stomach cancer Paternal Aunt   . Esophageal cancer Neg Hx   . Rectal cancer Neg Hx     Social History   Tobacco Use  . Smoking status: Former Smoker    Packs/day: 2.50    Years: 45.00    Pack years: 112.50    Types: Cigarettes    Quit date: 03/18/2002    Years since quitting: 18.5  . Smokeless tobacco: Never Used  Substance Use Topics  . Alcohol use: No  . Drug use: No    Home Medications Prior to Admission medications   Medication Sig Start Date End Date Taking? Authorizing Provider  albuterol (PROAIR HFA) 108 (90 BASE) MCG/ACT inhaler Inhale 2 puffs into the lungs every 6 (six) hours as needed for wheezing.    [provider]  amLODipine (NORVASC) 5 MG tablet Take 5 mg by mouth daily.    [provider]  atorvastatin (LIPITOR) 40 MG tablet Take 40 mg by mouth daily.    [provider]  Cholecalciferol (VITAMIN D-3) 1000 UNITS CAPS Take by mouth daily.    [provider]  cyanocobalamin 1000 MCG tablet Take 100 mcg by mouth daily.  [provider]  cyclobenzaprine (FLEXERIL) 10 MG tablet Take 10 mg by mouth 3 (three) times daily as needed.     [provider]  fish oil-omega-3 fatty acids 1000 MG capsule Take 2 g by mouth daily.    [provider]  Fluticasone-Salmeterol (ADVAIR) 100-50 MCG/DOSE AEPB Inhale 1 puff into the lungs 2 (two) times daily. 01/02/17   Parrett, Virgel Bouquet, NP  furosemide (LASIX) 20 MG tablet Daily for 3 days, then as needed for weight gain associated with leg swelling 02/13/14   Coralyn Helling, MD  Multiple Vitamins-Minerals (CENTRUM SILVER PO) Take by mouth daily.    [provider]  potassium chloride (K-DUR) 10 MEQ tablet Daily for 3 days, then as needed when taking lasix 02/13/14   Coralyn Helling, MD  Potassium  Gluconate 2 MEQ TABS Take by mouth daily.    [provider]  pyridoxine (B-6) 100 MG tablet Take 100 mg by mouth daily.    [provider]  telmisartan-hydrochlorothiazide (MICARDIS HCT) 80-25 MG per tablet Take 1 tablet by mouth daily.    [provider]  Turmeric Curcumin 500 MG CAPS Take by mouth. Take 2 daily    [provider]  vitamin C (ASCORBIC ACID) 500 MG tablet Take 500 mg by mouth daily.    [provider]  vitamin E 400 UNIT capsule Take 400 Units by mouth daily.    [provider]    Allergies    Codeine  Review of Systems   Review of Systems  Constitutional: Negative for fever.  Musculoskeletal: Positive for arthralgias.  Skin: Negative for rash and wound.  Neurological: Negative for numbness.    Physical Exam Updated Vital Signs BP 120/78 (BP Location: Right Arm)   Pulse 74   Temp 97.6 F (36.4 C) (Oral)   Resp 14   Wt 99.8 kg   SpO2 96%   BMI 42.54 kg/m   Physical Exam Vitals and nursing note reviewed.  Constitutional:      General: She is not in acute distress.    Appearance: She is well-developed. She is obese.  HENT:     Head: Atraumatic.  Eyes:     Conjunctiva/sclera: Conjunctivae normal.  Musculoskeletal:        General: Tenderness (Left knee: Pain with knee flexion.  No deformity noted.  Patella is located.  No swelling no redness.) present.     Cervical back: Neck supple.     Comments: Left hip and left ankle nontender to palpation, intact DP pulse.  No significant midline spine tenderness crepitus or step-off.  Skin:    Findings: No rash.  Neurological:     Mental Status: She is alert.     ED Results / Procedures / Treatments   Labs (all labs ordered are listed, but only abnormal results are displayed) Labs Reviewed - No data to display  EKG None  Radiology DG Knee Complete 4 Views Left  Result Date: 10/10/2020 CLINICAL DATA:  Pain for 3 days no known injury EXAM: LEFT KNEE  - COMPLETE 4+ VIEW COMPARISON:  none FINDINGS: No sign of acute fracture or dislocation. Well corticated bony fragments are seen on lateral projection and there is a well corticated bony fragment that is demonstrated on the AP view projecting in line with the tibial spines. Lateral view limited by obliquity without gross effusion. Degenerative changes about the patellofemoral joint and medial compartment, mild. IMPRESSION: 1. No acute fracture or dislocation. 2. Well corticated bony fragments on lateral  projection, may represent loose bodies in the joint capsule, potentially related to prior injury. 3. Mild degenerative changes. Electronically Signed   By: Donzetta KohutGeoffrey  Wile M.D.   On: 10/10/2020 12:39   DG Hip Unilat W or Wo Pelvis 2-3 Views Left  Result Date: 10/10/2020 CLINICAL DATA:  LEFT hip and leg pain, LEFT knee hip and leg. EXAM: DG HIP (WITH OR WITHOUT PELVIS) 2-3V LEFT COMPARISON:  LEFT knee the of the same date. FINDINGS: No fracture of the bony pelvis. Osteopenia. Degenerative changes about the hips. LEFT hip is located.  No sign of acute fracture or dislocation. IMPRESSION: Osteopenia and degenerative change without acute finding. Electronically Signed   By: Donzetta KohutGeoffrey  Wile M.D.   On: 10/10/2020 13:35   VAS US LOWER EXTREMITY VENOUS (DVT) ([facility] and [facility] 7a-7p)  Result Date: 10/10/2020  Lower Venous DVTStudy Indications: Pain, and Swelling.  Risk Factors: Obesity. Performing Technologist: Jannet AskewVernon Matacale RCT RDMS  Examination Guidelines: A complete evaluation includes B-mode imaging, spectral Doppler, color Doppler, and power Doppler as needed of all accessible portions of each vessel. Bilateral testing is considered an integral part of a complete examination. Limited examinations for reoccurring indications may be performed as noted. The reflux portion of the exam is performed with the patient in reverse Trendelenburg.  +---------+---------------+---------+-----------+----------+--------------+ LEFT      CompressibilityPhasicitySpontaneityPropertiesThrombus Aging +---------+---------------+---------+-----------+----------+--------------+ CFV      Full           Yes      Yes                                 +---------+---------------+---------+-----------+----------+--------------+ SFJ      Full                                                        +---------+---------------+---------+-----------+----------+--------------+ FV Prox  Full                                                        +---------+---------------+---------+-----------+----------+--------------+ FV Mid   Full                                                        +---------+---------------+---------+-----------+----------+--------------+ FV DistalFull                                                        +---------+---------------+---------+-----------+----------+--------------+ PFV      Full                                                        +---------+---------------+---------+-----------+----------+--------------+ POP      Full  Yes      Yes                                 +---------+---------------+---------+-----------+----------+--------------+ PTV      Full                                                        +---------+---------------+---------+-----------+----------+--------------+ PERO     Full                                                        +---------+---------------+---------+-----------+----------+--------------+     Summary: RIGHT: - No evidence of common femoral vein obstruction.  LEFT: - There is no evidence of deep vein thrombosis in the lower extremity.  - No cystic structure found in the popliteal fossa.  *See table(s) above for measurements and observations.    Preliminary     Procedures Procedures (including critical care time)  Medications Ordered in ED Medications  oxyCODONE-acetaminophen (PERCOCET/ROXICET) 5-325 MG per  tablet 1 tablet (has no administration in time range)  oxyCODONE-acetaminophen (PERCOCET/ROXICET) 5-325 MG per tablet 1 tablet (1 tablet Oral Given 10/10/20 1220)    ED Course  I have reviewed the triage vital signs and the nursing notes.  Pertinent labs & imaging results that were available during my care of the patient were reviewed by me and considered in my medical decision making (see chart for details).    MDM Rules/Calculators/A&P                          BP 120/78 (BP Location: Right Arm)   Pulse 74   Temp 97.6 F (36.4 C) (Oral)   Resp 14   Wt 99.8 kg   SpO2 96%   BMI 42.54 kg/m   Final Clinical Impression(s) / ED Diagnoses Final diagnoses:  Primary osteoarthritis of left knee    Rx / DC Orders ED Discharge Orders         Ordered    oxyCODONE-acetaminophen (PERCOCET) 5-325 MG tablet  Every 6 hours PRN        10/10/20 1458         11:40 AM Patient report left knee pain after heavy lifting several days prior.  Pain is reproducible with knee flexion.  Will obtain x-ray, will give pain medication here.  I have low suspicion for cauda equina or infectious etiology.  Doubt DVT.  2:54 PM Fortunately x-ray of the left hip, left knee, as well as ultrasound of the left leg without acute finding.  I suspect patient's pain is likely worsened aggravation of her osteoarthritis.  We did reach out to our social work to help patient with home health care and will also provide a walker for ambulatory support.  Will provide a short course of opiate pain medication to go home as over-the-counter medication has not provided adequate relief.  Outpatient follow-up with orthopedic given.  Return precaution discussed.  Care discussed with Dr. Jacqulyn Bath.    Fayrene Helper, PA-C 10/10/20 1459    Long, Arlyss Repress, MD 10/11/20 1348

## 2020-10-10 NOTE — Discharge Instructions (Signed)
Use your walker as needed for ambulation.  Continue to wrap your left knee with Ace wrap for support.  Take Percocet only when pain is severe otherwise take over-the-counter medication.  Follow-up with your doctor for further care.

## 2020-10-10 NOTE — TOC Initial Note (Cosign Needed Addendum)
Transition of Care [facility]) - Initial/Assessment Note    Patient Details  Name: Anne Juarez MRN: [id number] Date of Birth: [birth year]  [facility] of [facility]) CM/SW Contact:    Lockie Pares, RN Phone Number: 10/10/2020, 1:38 PM  Clinical Narrative:                  Patient came to ED with Knee  Pain since Friday. Daughter in room, patient gone for procedure. She states that patient is very private, has several cats and dog in the home.  The patient lives alone. She questions whether she would want outside help. She would definitely like and use a walker.  Will interview with patient upon her return from radiology.   1350 rolling walker ordered through adapt     Patient Goals and CMS Choice        Expected Discharge Plan and Services                                                Prior Living Arrangements/Services                       Activities of Daily Living      Permission Sought/Granted                  Emotional Assessment              Admission diagnosis:  left knee pain Patient Active Problem List   Diagnosis Date Noted  . Peripheral edema 02/13/2014  . Coronary artery calcification 01/31/2014  . OSA (obstructive sleep apnea) 07/01/2013  . Pulmonary emphysema (HCC) 05/20/2013  . Pulmonary nodule, right 05/20/2013  . Dyspnea 04/25/2013  . Chronic cough 04/25/2013   PCP:  Georgianne Fick, MD Pharmacy:   [facility] [address], [place] - [address] [facility] [place] [address] Phone: [phone] Fax: [phone]  [facility] - [place], [place] - [address] [place] [address] Phone: [phone] Fax: [phone]  [facility] - [address] [place]), [place] - [address] [place] [address] Phone: [phone] Fax: [phone]     Social Determinants of  Health (SDOH) Interventions    Readmission Risk Interventions No flowsheet data found.

## 2020-10-10 NOTE — Telephone Encounter (Signed)
ED CM received hand off to contact patient with informaton concerning HH referral being accepted by [facility], CM attempted no answer or VM to leave message. CM will make a 2nd attempt later today.

## 2020-10-10 NOTE — ED Triage Notes (Signed)
C/o L knee pain since Friday that radiates up to L hip and across lower back.  No known injury.

## 2020-10-21 ENCOUNTER — Institutional Professional Consult (permissible substitution): Payer: HMO | Admitting: Pulmonary Disease

## 2020-10-21 ENCOUNTER — Other Ambulatory Visit: Payer: Self-pay | Admitting: *Deleted

## 2020-10-21 NOTE — Patient Outreach (Signed)
  [facility]) Care Management Chronic Special Needs Program    10/21/2020  Name: Anne Juarez, DOB: Feb 23, 1941  MRN: [id number]   Anne Juarez is enrolled in a chronic special needs plan for Diabetes.  [facility] [employer] care management team has assumed care and services for this member.  Case closed by [facility] care management.  Irving Shows [facility], BSN [facility] RN Care Coordinator, CSNP [phone]

## 2020-11-09 DIAGNOSIS — M1712 Unilateral primary osteoarthritis, left knee: Secondary | ICD-10-CM | POA: Diagnosis not present

## 2020-11-09 DIAGNOSIS — M4302 Spondylolysis, cervical region: Secondary | ICD-10-CM | POA: Diagnosis not present

## 2020-11-09 DIAGNOSIS — M7582 Other shoulder lesions, left shoulder: Secondary | ICD-10-CM | POA: Diagnosis not present

## 2020-11-10 DIAGNOSIS — M5031 Other cervical disc degeneration,  high cervical region: Secondary | ICD-10-CM | POA: Diagnosis not present

## 2020-11-10 DIAGNOSIS — M47812 Spondylosis without myelopathy or radiculopathy, cervical region: Secondary | ICD-10-CM | POA: Diagnosis not present

## 2020-11-10 DIAGNOSIS — M19012 Primary osteoarthritis, left shoulder: Secondary | ICD-10-CM | POA: Diagnosis not present

## 2020-11-23 DIAGNOSIS — M4302 Spondylolysis, cervical region: Secondary | ICD-10-CM | POA: Diagnosis not present

## 2020-11-23 DIAGNOSIS — M1712 Unilateral primary osteoarthritis, left knee: Secondary | ICD-10-CM | POA: Diagnosis not present

## 2020-11-23 DIAGNOSIS — M19012 Primary osteoarthritis, left shoulder: Secondary | ICD-10-CM | POA: Diagnosis not present

## 2020-11-23 DIAGNOSIS — M7582 Other shoulder lesions, left shoulder: Secondary | ICD-10-CM | POA: Diagnosis not present

## 2020-11-30 ENCOUNTER — Ambulatory Visit: Payer: Self-pay | Admitting: *Deleted

## 2020-12-16 ENCOUNTER — Ambulatory Visit: Payer: HMO | Admitting: Family Medicine

## 2020-12-22 ENCOUNTER — Ambulatory Visit: Payer: HMO | Admitting: Family Medicine

## 2021-06-28 DIAGNOSIS — Z23 Encounter for immunization: Secondary | ICD-10-CM | POA: Diagnosis not present

## 2021-06-28 DIAGNOSIS — Z6839 Body mass index (BMI) 39.0-39.9, adult: Secondary | ICD-10-CM | POA: Diagnosis not present

## 2021-06-28 DIAGNOSIS — E1129 Type 2 diabetes mellitus with other diabetic kidney complication: Secondary | ICD-10-CM | POA: Diagnosis not present

## 2021-06-28 DIAGNOSIS — J449 Chronic obstructive pulmonary disease, unspecified: Secondary | ICD-10-CM | POA: Diagnosis not present

## 2021-06-28 DIAGNOSIS — E1159 Type 2 diabetes mellitus with other circulatory complications: Secondary | ICD-10-CM | POA: Diagnosis not present

## 2021-06-28 DIAGNOSIS — I10 Essential (primary) hypertension: Secondary | ICD-10-CM | POA: Diagnosis not present

## 2021-06-28 DIAGNOSIS — F339 Major depressive disorder, recurrent, unspecified: Secondary | ICD-10-CM | POA: Diagnosis not present

## 2021-10-26 DIAGNOSIS — E119 Type 2 diabetes mellitus without complications: Secondary | ICD-10-CM | POA: Diagnosis not present

## 2021-11-02 DIAGNOSIS — I10 Essential (primary) hypertension: Secondary | ICD-10-CM | POA: Diagnosis not present

## 2021-11-02 DIAGNOSIS — M797 Fibromyalgia: Secondary | ICD-10-CM | POA: Diagnosis not present

## 2021-11-02 DIAGNOSIS — R2241 Localized swelling, mass and lump, right lower limb: Secondary | ICD-10-CM | POA: Diagnosis not present

## 2021-11-02 DIAGNOSIS — M79604 Pain in right leg: Secondary | ICD-10-CM | POA: Diagnosis not present

## 2021-11-03 ENCOUNTER — Ambulatory Visit (HOSPITAL_COMMUNITY)
Admission: RE | Admit: 2021-11-03 | Discharge: 2021-11-03 | Disposition: A | Discharge status: Home / Self Care | Payer: HMO | Source: Ambulatory Visit | Attending: Adult Health | Admitting: Adult Health

## 2021-11-03 ENCOUNTER — Other Ambulatory Visit (HOSPITAL_COMMUNITY): Payer: Self-pay | Admitting: Adult Health

## 2021-11-03 ENCOUNTER — Other Ambulatory Visit: Payer: Self-pay

## 2021-11-03 DIAGNOSIS — M79604 Pain in right leg: Secondary | ICD-10-CM | POA: Diagnosis not present

## 2021-11-08 ENCOUNTER — Other Ambulatory Visit: Payer: Self-pay

## 2021-11-08 ENCOUNTER — Ambulatory Visit: Payer: HMO | Admitting: Orthopaedic Surgery

## 2021-11-08 ENCOUNTER — Encounter: Payer: Self-pay | Admitting: Orthopaedic Surgery

## 2021-11-08 DIAGNOSIS — G8929 Other chronic pain: Secondary | ICD-10-CM | POA: Diagnosis not present

## 2021-11-08 DIAGNOSIS — M25561 Pain in right knee: Secondary | ICD-10-CM | POA: Diagnosis not present

## 2021-11-08 NOTE — Progress Notes (Signed)
Office Visit Note   Patient: Anne Juarez           Date of Birth: 1941-11-07           MRN: [id number] Visit Date: 11/08/2021              Requested by: Melida Quitter, MD [address] [place],  [place] [address] PCP: Melida Quitter, MD   Assessment & Plan: Visit Diagnoses:  1. Chronic pain of right knee     Plan: Anne Juarez was referred for evaluation of the mass behind her right knee.  She notes that it occurred rather spontaneously and was somewhat firm last week but it is much softer this week and really is not causing her much pain.  She further relates that she had x-rays of her left knee last year with some evidence of arthritis and also has arthritis in her hands and her shoulders.  Her right knee is not terribly uncomfortable although she does get stiff when she sits for long period of time she has tried some over-the-counter medicines and even Voltaren gel which helps.  We did not obtain x-rays as she was somewhat hesitant to consider them but as I reviewed the films of her left knee she had some mild medial joint arthritis with subchondral sclerosis but the joint space was well-maintained.  I suspect she has the same in her right knee.  She did have a palpable popliteal cyst but it did not pulsate and she had +1 pulses distally.  Recently had an ultrasound of her knee demonstrating a large cystic area but no vascular abnormalities.  Knee range of motion was 0 to about 100 degrees without instability and little if any discomfort along the medial lateral joint.  Long discussion regarding her diagnosis and different treatment options.  She is aware that cortisone may be helpful.  She had one in her left knee last year which helped temporarily.  Also discussed use of a pullover knee support for the winter.  All questions were answered.  She will return as needed. She does have an asymptomatic popliteal cyst related to the arthritis  Follow-Up Instructions: Return if symptoms worsen or fail  to improve.   Orders:  No orders of the defined types were placed in this encounter.  No orders of the defined types were placed in this encounter.     Procedures: No procedures performed   Clinical Data: No additional findings.   Subjective: Chief Complaint  Patient presents with   Right Knee - Pain  Patient presents today for right knee pain. She said that it has been hurting for about a month. She has pain posteriorly and constantly. She noticed last week that she had a knot behind her knee. She saw her PCP and was told it was Bakers Cyst. She had an ultrasound the following day to confirm. She takes Tylenol for pain relief. She does not want x-rays.  HPI  Review of Systems   Objective: Vital Signs: There were no vitals taken for this visit.  Physical Exam Constitutional:      Appearance: She is well-developed.  Pulmonary:     Effort: Pulmonary effort is normal.  Skin:    General: Skin is warm and dry.  Neurological:     Mental Status: She is alert and oriented to person, place, and time.  Psychiatric:        Behavior: Behavior normal.    Ortho Exam awake alert and oriented x3.  Comfortable sitting.  Right knee was not hot warm or red.  No effusion.  Minimal varus.  She did have a palpable popliteal cyst that seem to be longitudinal in appearance extending into the very proximal aspect of her calf but asymptomatic and nontender.  No distal edema.  +1 pulses pedal.  Feet were warm.  Painless range of motion of her hip.  Straight leg raise negative.  Some mild patella crepitation but no pain with patella compression.  Full extension flexion about 100 degrees  Specialty Comments:  No specialty comments available.  Imaging: No results found.   PMFS History: Patient Active Problem List   Diagnosis Date Noted   Pain in right knee 11/08/2021   Peripheral edema 02/13/2014   Coronary artery calcification 01/31/2014   OSA (obstructive sleep apnea) 07/01/2013    Pulmonary emphysema (HCC) 05/20/2013   Pulmonary nodule, right 05/20/2013   Dyspnea 04/25/2013   Chronic cough 04/25/2013   Past Medical History:  Diagnosis Date   Anxiety    Depression    Diabetes mellitus without complication (HCC)    Hyperlipidemia    Hypertension    OSA (obstructive sleep apnea) 07/01/2013   Osteoarthritis (arthritis due to wear and tear of joints)    neck and knees,pelvic bone and left shoulder    Family History  Problem Relation Age of Onset   Colon cancer Sister    Liver cancer Brother    Stomach cancer Paternal Aunt    Esophageal cancer Neg Hx    Rectal cancer Neg Hx     Past Surgical History:  Procedure Laterality Date   COLONOSCOPY     LAPAROSCOPIC TUBAL LIGATION     PILONIDAL CYST EXCISION     POLYPECTOMY     TONSILLECTOMY     Social History   Occupational History   Not on file  Tobacco Use   Smoking status: Former    Packs/day: 2.50    Years: 45.00    Pack years: 112.50    Types: Cigarettes    Quit date: 03/18/2002    Years since quitting: 19.6   Smokeless tobacco: Never  Substance and Sexual Activity   Alcohol use: No   Drug use: No   Sexual activity: Not on file

## 2021-12-13 DIAGNOSIS — E1159 Type 2 diabetes mellitus with other circulatory complications: Secondary | ICD-10-CM | POA: Diagnosis not present

## 2021-12-13 DIAGNOSIS — I10 Essential (primary) hypertension: Secondary | ICD-10-CM | POA: Diagnosis not present

## 2021-12-13 DIAGNOSIS — E785 Hyperlipidemia, unspecified: Secondary | ICD-10-CM | POA: Diagnosis not present

## 2021-12-13 DIAGNOSIS — E1129 Type 2 diabetes mellitus with other diabetic kidney complication: Secondary | ICD-10-CM | POA: Diagnosis not present

## 2021-12-15 DIAGNOSIS — I10 Essential (primary) hypertension: Secondary | ICD-10-CM | POA: Diagnosis not present

## 2021-12-15 DIAGNOSIS — E785 Hyperlipidemia, unspecified: Secondary | ICD-10-CM | POA: Diagnosis not present

## 2021-12-29 DIAGNOSIS — Z1331 Encounter for screening for depression: Secondary | ICD-10-CM | POA: Diagnosis not present

## 2021-12-29 DIAGNOSIS — Z6839 Body mass index (BMI) 39.0-39.9, adult: Secondary | ICD-10-CM | POA: Diagnosis not present

## 2021-12-29 DIAGNOSIS — J449 Chronic obstructive pulmonary disease, unspecified: Secondary | ICD-10-CM | POA: Diagnosis not present

## 2021-12-29 DIAGNOSIS — E1159 Type 2 diabetes mellitus with other circulatory complications: Secondary | ICD-10-CM | POA: Diagnosis not present

## 2021-12-29 DIAGNOSIS — Z0000 Encounter for general adult medical examination without abnormal findings: Secondary | ICD-10-CM | POA: Diagnosis not present

## 2021-12-29 DIAGNOSIS — I10 Essential (primary) hypertension: Secondary | ICD-10-CM | POA: Diagnosis not present

## 2021-12-29 DIAGNOSIS — Z1339 Encounter for screening examination for other mental health and behavioral disorders: Secondary | ICD-10-CM | POA: Diagnosis not present

## 2021-12-29 DIAGNOSIS — F339 Major depressive disorder, recurrent, unspecified: Secondary | ICD-10-CM | POA: Diagnosis not present

## 2021-12-29 DIAGNOSIS — E785 Hyperlipidemia, unspecified: Secondary | ICD-10-CM | POA: Diagnosis not present

## 2021-12-29 DIAGNOSIS — R002 Palpitations: Secondary | ICD-10-CM | POA: Diagnosis not present

## 2021-12-29 DIAGNOSIS — M797 Fibromyalgia: Secondary | ICD-10-CM | POA: Diagnosis not present

## 2022-01-17 NOTE — Progress Notes (Deleted)
Cardiology Office Note:    Date:  01/17/2022   ID:  Anne Juarez, DOB 02/22/1941, MRN [id number]  PCP:  Michael Boston, MD   [facility] Providers Cardiologist:  Lenna Sciara, MD Referring MD: Michael Boston, MD   Chief Complaint/Reason for Referral: Palpitations and chest pain  ASSESSMENT:    Chest pain, unspecified type  Palpitations  Type 2 diabetes mellitus without complication, without long-term current use of insulin ([place])  Hypertension, unspecified type  Hyperlipidemia, unspecified hyperlipidemia type    PLAN:    In order of problems listed above:  1.  Chest pain  2.  Palpitations  3.  Start aspirin 81 mg daily, continue Micardis, continue atorvastatin, and consider SGLT2 inhibitor.  4.  Hypertension  5.  Continue atorvastatin goal LDL is less than 70 but as low as possible and as tolerated.          {Are you ordering a CV Procedure (e.g. stress test, cath, DCCV, TEE, etc)?   Press F2        :[id number]   Dispo:  No follow-ups on file.     Medication Adjustments/Labs and Tests Ordered: Current medicines are reviewed at length with the patient today.  Concerns regarding medicines are outlined above.   Tests Ordered: No orders of the defined types were placed in this encounter.   Medication Changes: No orders of the defined types were placed in this encounter.   History of Present Illness:    The patient is a 81 y.o. female with the indicated medical history here referred for recommendations regarding chest pain and palpitations.  The patient was seen by her primary care provider earlier this month.  3 weeks prior to this visit the patient reported an episode of chest pain at rest lasting about 10 minutes.  She also reported palpitations.       Previous Medical History: Past Medical History:  Diagnosis Date   Anxiety    Depression    Diabetes mellitus without complication (HCC)    Hyperlipidemia    Hypertension    OSA (obstructive  sleep apnea) 07/01/2013   Osteoarthritis (arthritis due to wear and tear of joints)    neck and knees,pelvic bone and left shoulder     Current Medications: No outpatient medications have been marked as taking for the 01/19/22 encounter (Appointment) with Early Osmond, MD.     Allergies:    Adhesive [tape] and Codeine   Social History:   Social History   Tobacco Use   Smoking status: Former    Packs/day: 2.50    Years: 45.00    Pack years: 112.50    Types: Cigarettes    Quit date: 03/18/2002    Years since quitting: 19.8   Smokeless tobacco: Never  Substance Use Topics   Alcohol use: No   Drug use: No     Family Hx: Family History  Problem Relation Age of Onset   Colon cancer Sister    Liver cancer Brother    Stomach cancer Paternal Aunt    Esophageal cancer Neg Hx    Rectal cancer Neg Hx      Review of Systems:   Please see the history of present illness.    All other systems reviewed and are negative.     EKGs/Labs/Other Test Reviewed:    EKG:  *** EKG from 2015 demonstrates normal sinus rhythm with nonspecific ST and T wave changes. Prior CV studies: 2018 TTE  - Left ventricle: The cavity size  was normal. Wall thickness was    increased in a pattern of moderate LVH. Systolic function was    normal. The estimated ejection fraction was in the range of 60%    to 65%. Doppler parameters are consistent with abnormal left    ventricular relaxation (grade 1 diastolic dysfunction).  - Left atrium: The atrium was mildly dilated.   Imaging studies that I have independently reviewed today: Echocardiogram and EKG  Recent Labs: No results found for requested labs within last 8760 hours.   Recent Lipid Panel No results found for: CHOL, TRIG, HDL, LDLCALC, LDLDIRECT  Risk Assessment/Calculations:    {Does this patient have ATRIAL FIBRILLATION?:[phone]}      Physical Exam:    VS:  There were no vitals taken for this visit.   Wt Readings from Last 3  Encounters:  10/10/20 220 lb (99.8 kg)  05/31/17 231 lb 3.2 oz (104.9 kg)  05/02/17 233 lb (105.7 kg)    GENERAL:  No apparent distress, AOx3 HEENT:  No carotid bruits, +2 carotid impulses, no scleral icterus CAR: RRR Irregular RR*** no murmurs***, gallops, rubs, or thrills RES:  Clear to auscultation bilaterally ABD:  Soft, nontender, nondistended, positive bowel sounds x 4 VASC:  +2 radial pulses, +2 carotid pulses, palpable pedal pulses NEURO:  CN 2-12 grossly intact; motor and sensory grossly intact PSYCH:  No active depression or anxiety EXT:  No edema, ecchymosis, or cyanosis  Signed, Early Osmond, MD  01/17/2022 1:12 PM    [facility] [place], [place], [place]  [address] Phone: [phone]; Fax: [phone]   Note:  This document was prepared using Dragon voice recognition software and may include unintentional dictation errors.

## 2022-01-19 ENCOUNTER — Ambulatory Visit: Payer: HMO | Admitting: Internal Medicine

## 2022-01-19 DIAGNOSIS — R002 Palpitations: Secondary | ICD-10-CM

## 2022-01-19 DIAGNOSIS — I10 Essential (primary) hypertension: Secondary | ICD-10-CM

## 2022-01-19 DIAGNOSIS — E785 Hyperlipidemia, unspecified: Secondary | ICD-10-CM

## 2022-01-19 DIAGNOSIS — E119 Type 2 diabetes mellitus without complications: Secondary | ICD-10-CM

## 2022-01-19 DIAGNOSIS — R079 Chest pain, unspecified: Secondary | ICD-10-CM

## 2022-01-21 NOTE — Progress Notes (Deleted)
Cardiology Office Note:    Date:  01/21/2022   ID:  Anne Juarez, DOB 1941-07-05, MRN [id number]  PCP:  Anne Boston, MD   [facility] Providers Cardiologist:  Lenna Sciara, MD Referring MD: Anne Boston, MD   Chief Complaint/Reason for Referral: Palpitations and chest pain  ASSESSMENT:    Palpitations  Chest pain, unspecified type  Type 2 diabetes mellitus without complication, without long-term current use of insulin ([place])  Hyperlipidemia, unspecified hyperlipidemia type  Hypertension, unspecified type    PLAN:    In order of problems listed above:  1.  We will place monitor and obtain echocardiogram.  TSH and other labs recently was in the normal.  Follow-up  2.  Chest pain  3.  Start aspirin 81 mg given history of diabetes.  Stop amlodipine and start losartan 25 mg.  Continue atorvastatin for goal LDL of less than 70.  Consider SGLT2 inhibitor.  4.  This is being followed by patient's primary care provider, I recommend obtaining a LDL of less than 70.  Her recent LDL was close to this.  It was 79.  5.  Hypertension           {Are you ordering a CV Procedure (e.g. stress test, cath, DCCV, TEE, etc)?   Press F2        :[id number]   Dispo:  No follow-ups on file.     Medication Adjustments/Labs and Tests Ordered: Current medicines are reviewed at length with the patient today.  Concerns regarding medicines are outlined above.   Tests Ordered: No orders of the defined types were placed in this encounter.   Medication Changes: No orders of the defined types were placed in this encounter.   History of Present Illness:    FOCUSED CARDIOVASCULAR PROBLEM LIST:   1.  Type 2 diabetes 2.  Hyperlipidemia 3.  With hypertension 4.  COPD 5.  BMI of 39  The patient is a 81 y.o. female with the indicated medical history here for recommendations regarding palpitations.  Patient also had an episode of chest pain several weeks ago.       Previous Medical  History: Past Medical History:  Diagnosis Date   Anxiety    BMI 39.0-39.9,adult    COPD (chronic obstructive pulmonary disease) (HCC)    Depression    Diabetes mellitus without complication (HCC)    DM (diabetes mellitus) (HCC)    Fibromyalgia    Hyperlipidemia    Hypertension    Obesity    OSA (obstructive sleep apnea) 07/01/2013   Osteoarthritis (arthritis due to wear and tear of joints)    neck and knees,pelvic bone and left shoulder   Palpitations    Spondylolysis    Synovial cyst      Current Medications: No outpatient medications have been marked as taking for the 01/23/22 encounter (Appointment) with Anne Osmond, MD.     Allergies:    Adhesive [tape] and Codeine   Social History:   Social History   Tobacco Use   Smoking status: Former    Packs/day: 2.50    Years: 45.00    Pack years: 112.50    Types: Cigarettes    Quit date: 03/18/2002    Years since quitting: 19.8   Smokeless tobacco: Never  Substance Use Topics   Alcohol use: No   Drug use: No     Family Hx: Family History  Problem Relation Age of Onset   Colon cancer Sister    Liver cancer  Brother    Stomach cancer Paternal Aunt    Esophageal cancer Neg Hx    Rectal cancer Neg Hx      Review of Systems:   Please see the history of present illness.    All other systems reviewed and are negative.     EKGs/Labs/Other Test Reviewed:    EKG:  ***  EKG 2015 demonstrates sinus rhythm with nonspecific ST and T wave changes  Prior CV studies: TTE 2018 - Left ventricle: The cavity size was normal. Wall thickness was    increased in a pattern of moderate LVH. Systolic function was    normal. The estimated ejection fraction was in the range of 60%    to 65%. Doppler parameters are consistent with abnormal left    ventricular relaxation (grade 1 diastolic dysfunction).  - Left atrium: The atrium was mildly dilated.   Imaging studies that I have independently reviewed today: ***  Recent  Labs: External labs show sodium 140 potassium 3.4 BUN 8 creatinine 0.8 glucose 115 hemoglobin 14.8, platelets 2036, cholesterol 154, triglycerides 126, HDL 50, LDL 79  Risk Assessment/Calculations:    {Does this patient have ATRIAL FIBRILLATION?:[phone]}      Physical Exam:    VS:  There were no vitals taken for this visit.   Wt Readings from Last 3 Encounters:  10/10/20 220 lb (99.8 kg)  05/31/17 231 lb 3.2 oz (104.9 kg)  05/02/17 233 lb (105.7 kg)    GENERAL:  No apparent distress, AOx3 HEENT:  No carotid bruits, +2 carotid impulses, no scleral icterus CAR: RRR Irregular RR*** no murmurs***, gallops, rubs, or thrills RES:  Clear to auscultation bilaterally ABD:  Soft, nontender, nondistended, positive bowel sounds x 4 VASC:  +2 radial pulses, +2 carotid pulses, palpable pedal pulses NEURO:  CN 2-12 grossly intact; motor and sensory grossly intact PSYCH:  No active depression or anxiety EXT:  No edema, ecchymosis, or cyanosis  Signed, Anne Osmond, MD  01/21/2022 6:30 AM    [place], [place],   [address] Phone: [phone]; Fax: [phone]   Note:  This document was prepared using Dragon voice recognition software and may include unintentional dictation errors.

## 2022-01-23 ENCOUNTER — Ambulatory Visit: Payer: HMO | Admitting: Internal Medicine

## 2022-01-23 DIAGNOSIS — E785 Hyperlipidemia, unspecified: Secondary | ICD-10-CM

## 2022-01-23 DIAGNOSIS — R079 Chest pain, unspecified: Secondary | ICD-10-CM

## 2022-01-23 DIAGNOSIS — I10 Essential (primary) hypertension: Secondary | ICD-10-CM

## 2022-01-23 DIAGNOSIS — R002 Palpitations: Secondary | ICD-10-CM

## 2022-01-23 DIAGNOSIS — E119 Type 2 diabetes mellitus without complications: Secondary | ICD-10-CM

## 2022-02-06 ENCOUNTER — Telehealth: Payer: Self-pay | Admitting: Pulmonary Disease

## 2022-02-06 NOTE — Telephone Encounter (Signed)
LOV in 05/31/17. Pt states her current CPAP is broken and would like a new one called into [facility]. I explained MULTIPLE times that she is no longer considered a pt and that VS is unlikely to call anything for her because it's been so long. We scheduled a sleep consult with VS on 4/17 as that was Sood's soonest available.  Pt refused appt to see anyone else sooner. Please call pt back and discuss with her per her request.

## 2022-02-07 NOTE — Telephone Encounter (Signed)
Called patient but she did not answer. Left message for her to call back.  

## 2022-02-13 ENCOUNTER — Encounter: Payer: Self-pay | Admitting: Cardiovascular Disease

## 2022-02-13 NOTE — Progress Notes (Signed)
Cardiology Office Note:    Date:  02/14/2022   ID:  Anne Juarez, DOB November 12, 1941, MRN [id number]  PCP:  Melida Quitter, MD   [facility] Providers Cardiologist:  Chanae Gemma    Referring MD: Melida Quitter, MD   Chief Complaint  Patient presents with   Palpitations     History of Present Illness:    Anne Juarez is a 81 y.o. female with a hx of  COPD, HLD, HTN, obesity   We were asked to see her by Dr. Nadene Rubins for further eval and management of her CP and palpitations .   She has been feeling well.  She was found to have some irreg on ECG and we were asked to see her .  No CP , has chronic dyspnea ( COPD )  Lots of phlegm. Walks her dog several times a day ,   does housework,  shops on her own , does her own yard work , [employer],  trims hedges, light tree work  Has some worsening of her dyspnea with activity  Is very limited by arthritis . ( Spine issues )   No chest fullness, tightness, no CP   Has had palitations in the past .   Has severe OSA.   CPAP machine is broken - has not bought another one. Needs to see another sleep doctor   Saw Dr. Craige Cotta previously    Past Medical History:  Diagnosis Date   Anxiety    BMI 39.0-39.9,adult    COPD (chronic obstructive pulmonary disease) (HCC)    Depression    Diabetes mellitus without complication (HCC)    DM (diabetes mellitus) (HCC)    Fibromyalgia    Hyperlipidemia    Hypertension    Obesity    OSA (obstructive sleep apnea) 07/01/2013   Osteoarthritis (arthritis due to wear and tear of joints)    neck and knees,pelvic bone and left shoulder   Palpitations    Spondylolysis    Synovial cyst     Past Surgical History:  Procedure Laterality Date   COLONOSCOPY     LAPAROSCOPIC TUBAL LIGATION     PILONIDAL CYST EXCISION     POLYPECTOMY     TONSILLECTOMY      Current Medications: Current Meds  Medication Sig   albuterol (VENTOLIN HFA) 108 (90 Base) MCG/ACT inhaler Inhale 2 puffs into the lungs every 6 (six)  hours as needed for wheezing.   amLODipine (NORVASC) 5 MG tablet Take 5 mg by mouth daily.   atorvastatin (LIPITOR) 40 MG tablet Take 40 mg by mouth daily.   B Complex Vitamins (VITAMIN-B COMPLEX) TABS Take 1 tablet by mouth daily.   cyclobenzaprine (FLEXERIL) 10 MG tablet Take 10 mg by mouth 3 (three) times daily as needed for muscle spasms.    Fluticasone-Salmeterol (ADVAIR) 100-50 MCG/DOSE AEPB Inhale 1 puff into the lungs 2 (two) times daily.   Multiple Vitamins-Minerals (CENTRUM SILVER PO) Take 1 tablet by mouth daily after breakfast.    oxyCODONE-acetaminophen (PERCOCET) 5-325 MG tablet Take 1 tablet by mouth every 6 (six) hours as needed for moderate pain or severe pain.   Potassium 99 MG TABS Take 396 mg by mouth daily.   pyridoxine (B-6) 100 MG tablet Take 100 mg by mouth daily.   telmisartan-hydrochlorothiazide (MICARDIS HCT) 80-25 MG per tablet Take 1 tablet by mouth daily.   vitamin C (ASCORBIC ACID) 500 MG tablet Take 500 mg by mouth daily.   vitamin E 400 UNIT capsule Take 400  Units by mouth daily.     Allergies:   Adhesive [tape] and Codeine   Social History   Socioeconomic History   Marital status: Married    Spouse name: Not on file   Number of children: Not on file   Years of education: Not on file   Highest education level: Not on file  Occupational History   Not on file  Tobacco Use   Smoking status: Former    Packs/day: 2.50    Years: 45.00    Pack years: 112.50    Types: Cigarettes    Quit date: 03/18/2002    Years since quitting: 19.9   Smokeless tobacco: Never  Substance and Sexual Activity   Alcohol use: No   Drug use: No   Sexual activity: Not on file  Other Topics Concern   Not on file  Social History Narrative   Not on file   Social Determinants of Health   Financial Resource Strain: Not on file  Food Insecurity: Not on file  Transportation Needs: Not on file  Physical Activity: Not on file  Stress: Not on file  Social Connections: Not on  file     Family History: The patient's family history includes Colon cancer in her sister; Liver cancer in her brother; Stomach cancer in her paternal aunt. There is no history of Esophageal cancer or Rectal cancer.  ROS:   Please see the history of present illness.     All other systems reviewed and are negative.  EKGs/Labs/Other Studies Reviewed:    The following studies were reviewed today:  EKG:  Feb. 28, 2023 normal sinus rhythm at 76.  She has small Q waves in leads II, III and aVF.  She has nonspecific ST changes in her lateral leads.  Recent Labs: No results found for requested labs within last 8760 hours.  Recent Lipid Panel No results found for: CHOL, TRIG, HDL, CHOLHDL, VLDL, LDLCALC, LDLDIRECT   Risk Assessment/Calculations:           Physical Exam:    VS:  BP 100/60 (BP Location: Left Arm, Patient Position: Sitting, Cuff Size: Large)    Pulse 76    Ht 5\' 2"  (1.575 m)    Wt 212 lb (96.2 kg)    SpO2 92%    BMI 38.78 kg/m     Wt Readings from Last 3 Encounters:  02/14/22 212 lb (96.2 kg)  10/10/20 220 lb (99.8 kg)  05/31/17 231 lb 3.2 oz (104.9 kg)     GEN:   elderly , moderately obese female,  appears generally weak ,  in no acute distress HEENT: Normal NECK: No JVD; No carotid bruits LYMPHATICS: No lymphadenopathy CARDIAC: RRR, no murmurs, rubs, gallops RESPIRATORY:  Clear to auscultation without rales, wheezing or rhonchi  ABDOMEN: Soft, non-tender, non-distended MUSCULOSKELETAL:  No edema; No deformity  SKIN: Warm and dry NEUROLOGIC:  Alert and oriented x 3 PSYCHIATRIC:  Normal affect   ASSESSMENT:    1. Palpitations    PLAN:    In order of problems listed above:  HR irregularities: She is in normal sinus rhythm today.  There were no palpitations on exam.  I do not have [facility]. I would like to place a 14-day event monitor on her. We will also get an echocardiogram for further evaluation of her LV function.  She  had an echocardiogram in 2018 which revealed normal left ventricular systolic function.  She is not having any symptoms of  angina.  She does a moderate amount of exercise but is greatly limited by her total body arthritis.  We will have her follow-up with an APP in several months.  2.  OSA :   her CPAP is broken She needs to see Dr. Craige Cotta for repeat evaluatino to get another machine.   Since she has severe sleep apnea, it is possible that she may be having episodes of paroxysmal atrial fibrillation.  It will be important for [place] to make sure that we know whether or not she is actually having A-fib as she would need to be started on anticoagulation.         Medication Adjustments/Labs and Tests Ordered: Current medicines are reviewed at length with the patient today.  Concerns regarding medicines are outlined above.  Orders Placed This Encounter  Procedures   LONG TERM MONITOR (3-14 DAYS)   EKG 12-Lead   ECHOCARDIOGRAM COMPLETE   No orders of the defined types were placed in this encounter.   Patient Instructions  Medication Instructions:  Your physician recommends that you continue on your current medications as directed. Please refer to the Current Medication list given to you today.  *If you need a refill on your cardiac medications before your next appointment, please call your pharmacy*   Lab Work: NONE If you have labs (blood work) drawn today and your tests are completely normal, you will receive your results only by: MyChart Message (if you have MyChart) OR A paper copy in the mail If you have any lab test that is abnormal or we need to change your treatment, we will call you to review the results.   Testing/Procedures: Your physician has requested that you wear a 14 day heart monitor.   Your physician has requested that you have an echocardiogram. Echocardiography is a painless test that uses sound waves to create images of your heart. It provides your doctor with  information about the size and shape of your heart and how well your hearts chambers and valves are working. This procedure takes approximately one hour. There are no restrictions for this procedure.   Follow-Up: At [facility], you and your health needs are our priority.  As part of our continuing mission to provide you with exceptional heart care, we have created designated Provider Care Teams.  These Care Teams include your primary Cardiologist (physician) and Advanced Practice Providers (APPs -  Physician Assistants and Nurse Practitioners) who all work together to provide you with the care you need, when you need it.  We recommend signing up for the patient portal called "MyChart".  Sign up information is provided on this After Visit Summary.  MyChart is used to connect with patients for Virtual Visits (Telemedicine).  Patients are able to view lab/test results, encounter notes, upcoming appointments, etc.  Non-urgent messages can be sent to your provider as well.   To learn more about what you can do with MyChart, go to [email or web address].    Your next appointment:   2 - 3  month(s)  The format for your next appointment:   In Person  Provider:   Eligha Bridegroom, NP or Tereso Newcomer, PA-C        Other Instructions Anne Juarez- Long Term Monitor Instructions  Your physician has requested you wear a ZIO patch monitor for 14 days.  This is a single patch monitor. [employer] supplies one patch monitor per enrollment. Additional stickers are not available. Please do not apply patch if you will be having a  Nuclear Stress Test,  Echocardiogram, Cardiac CT, MRI, or Chest Xray during the period you would be wearing the  monitor. The patch cannot be worn during these tests. You cannot remove and re-apply the  ZIO XT patch monitor.  Your ZIO patch monitor will be mailed 3 day USPS to your address on file. It may take 3-5 days  to receive your monitor after you have been enrolled.  Once you  have received your monitor, please review the enclosed instructions. Your monitor  has already been registered assigning a specific monitor serial # to you.  Billing and Patient Assistance Program Information  We have supplied [employer] with any of your insurance information on file for billing purposes. [employer] offers a sliding scale Patient Assistance Program for patients that do not have  insurance, or whose insurance does not completely cover the cost of the ZIO monitor.  You must apply for the Patient Assistance Program to qualify for this discounted rate.  To apply, please call [employer] at [phone], select option 4, select option 2, ask to apply for  Patient Assistance Program. Anne Juarez will ask your household income, and how many people  are in your household. They will quote your out-of-pocket cost based on that information.  [employer] will also be able to set up a 53-month, interest-free payment plan if needed.  Applying the monitor   Shave hair from upper left chest.  Hold abrader disc by orange tab. Rub abrader in 40 strokes over the upper left chest as  indicated in your monitor instructions.  Clean area with 4 enclosed alcohol pads. Let dry.  Apply patch as indicated in monitor instructions. Patch will be placed under collarbone on left  side of chest with arrow pointing upward.  Rub patch adhesive wings for 2 minutes. Remove white label marked "1". Remove the white  label marked "2". Rub patch adhesive wings for 2 additional minutes.  While looking in a mirror, press and release button in center of patch. A small green light will  flash 3-4 times. This will be your only indicator that the monitor has been turned on.  Do not shower for the first 24 hours. You may shower after the first 24 hours.  Press the button if you feel a symptom. You will hear a small click. Record Date, Time and  Symptom in the Patient Logbook.  When you are ready to remove the patch, follow  instructions on the last 2 pages of Patient  Logbook. Stick patch monitor onto the last page of Patient Logbook.  Place Patient Logbook in the blue and white box. Use locking tab on box and tape box closed  securely. The blue and white box has prepaid postage on it. Please place it in the mailbox as  soon as possible. Your physician should have your test results approximately 7 days after the  monitor has been mailed back to [facility].  Call [facility] Customer Care at [phone] if you have questions regarding  your ZIO XT patch monitor. Call them immediately if you see an orange light blinking on your  monitor.  If your monitor falls off in less than 4 days, contact our Monitor department at [phone].  If your monitor becomes loose or falls off after 4 days call [employer] at [phone] for  suggestions on securing your monitor     Signed, Kristeen Miss, MD  02/14/2022 11:35 AM    [facility]

## 2022-02-14 ENCOUNTER — Other Ambulatory Visit: Payer: Self-pay

## 2022-02-14 ENCOUNTER — Other Ambulatory Visit: Payer: Self-pay | Admitting: Cardiovascular Disease

## 2022-02-14 ENCOUNTER — Ambulatory Visit (INDEPENDENT_AMBULATORY_CARE_PROVIDER_SITE_OTHER): Payer: HMO

## 2022-02-14 ENCOUNTER — Ambulatory Visit (INDEPENDENT_AMBULATORY_CARE_PROVIDER_SITE_OTHER): Payer: HMO | Admitting: Cardiovascular Disease

## 2022-02-14 ENCOUNTER — Encounter: Payer: Self-pay | Admitting: Cardiovascular Disease

## 2022-02-14 ENCOUNTER — Telehealth: Payer: Self-pay

## 2022-02-14 VITALS — BP 100/60 | HR 76 | Ht 62.0 in | Wt 212.0 lb

## 2022-02-14 DIAGNOSIS — R002 Palpitations: Secondary | ICD-10-CM

## 2022-02-14 NOTE — Telephone Encounter (Signed)
-----   Message from Coralyn Helling, MD sent at 02/14/2022 12:09 PM EST ----- Aleksa Catterton,  Can you schedule ROV with me or one of the NPs.  Thanks.  V  ----- Message ----- From: Vesta Mixer, MD Sent: 02/14/2022  11:12 AM EST To: Coralyn Helling, MD  Hi Vineet,  Would you have your office call ms. Baughman for a sooner appt. She has severe OSA,  CPAP has been broken She is having palpitations and I think she would benefit from seeing you or and APP sooner than later .  Thanks   [employer]

## 2022-02-14 NOTE — Progress Notes (Unsigned)
Enrolled patient for a 14 day Zio XT  monitor to be mailed to patients home  °

## 2022-02-14 NOTE — Patient Instructions (Addendum)
Medication Instructions:  Your physician recommends that you continue on your current medications as directed. Please refer to the Current Medication list given to you today.  *If you need a refill on your cardiac medications before your next appointment, please call your pharmacy*   Lab Work: NONE If you have labs (blood work) drawn today and your tests are completely normal, you will receive your results only by: MyChart Message (if you have MyChart) OR A paper copy in the mail If you have any lab test that is abnormal or we need to change your treatment, we will call you to review the results.   Testing/Procedures: Your physician has requested that you wear a 14 day heart monitor.   Your physician has requested that you have an echocardiogram. Echocardiography is a painless test that uses sound waves to create images of your heart. It provides your doctor with information about the size and shape of your heart and how well your hearts chambers and valves are working. This procedure takes approximately one hour. There are no restrictions for this procedure.   Follow-Up: At [facility], you and your health needs are our priority.  As part of our continuing mission to provide you with exceptional heart care, we have created designated Provider Care Teams.  These Care Teams include your primary Cardiologist (physician) and Advanced Practice Providers (APPs -  Physician Assistants and Nurse Practitioners) who all work together to provide you with the care you need, when you need it.  We recommend signing up for the patient portal called "MyChart".  Sign up information is provided on this After Visit Summary.  MyChart is used to connect with patients for Virtual Visits (Telemedicine).  Patients are able to view lab/test results, encounter notes, upcoming appointments, etc.  Non-urgent messages can be sent to your provider as well.   To learn more about what you can do with MyChart, go to  [email or web address].    Your next appointment:   2 - 3  month(s)  The format for your next appointment:   In Person  Provider:   Eligha Bridegroom, NP or Tereso Newcomer, PA-C        Other Instructions Christena Deem- Long Term Monitor Instructions  Your physician has requested you wear a ZIO patch monitor for 14 days.  This is a single patch monitor. [employer] supplies one patch monitor per enrollment. Additional stickers are not available. Please do not apply patch if you will be having a Nuclear Stress Test,  Echocardiogram, Cardiac CT, MRI, or Chest Xray during the period you would be wearing the  monitor. The patch cannot be worn during these tests. You cannot remove and re-apply the  ZIO XT patch monitor.  Your ZIO patch monitor will be mailed 3 day USPS to your address on file. It may take 3-5 days  to receive your monitor after you have been enrolled.  Once you have received your monitor, please review the enclosed instructions. Your monitor  has already been registered assigning a specific monitor serial # to you.  Billing and Patient Assistance Program Information  We have supplied [employer] with any of your insurance information on file for billing purposes. [employer] offers a sliding scale Patient Assistance Program for patients that do not have  insurance, or whose insurance does not completely cover the cost of the ZIO monitor.  You must apply for the Patient Assistance Program to qualify for this discounted rate.  To apply, please call [employer] at [phone], select option  4, select option 2, ask to apply for  Patient Assistance Program. Meredeth Ide will ask your household income, and how many people  are in your household. They will quote your out-of-pocket cost based on that information.  [employer] will also be able to set up a 30-month, interest-free payment plan if needed.  Applying the monitor   Shave hair from upper left chest.  Hold abrader disc by orange tab. Rub abrader  in 40 strokes over the upper left chest as  indicated in your monitor instructions.  Clean area with 4 enclosed alcohol pads. Let dry.  Apply patch as indicated in monitor instructions. Patch will be placed under collarbone on left  side of chest with arrow pointing upward.  Rub patch adhesive wings for 2 minutes. Remove white label marked "1". Remove the white  label marked "2". Rub patch adhesive wings for 2 additional minutes.  While looking in a mirror, press and release button in center of patch. A small green light will  flash 3-4 times. This will be your only indicator that the monitor has been turned on.  Do not shower for the first 24 hours. You may shower after the first 24 hours.  Press the button if you feel a symptom. You will hear a small click. Record Date, Time and  Symptom in the Patient Logbook.  When you are ready to remove the patch, follow instructions on the last 2 pages of Patient  Logbook. Stick patch monitor onto the last page of Patient Logbook.  Place Patient Logbook in the blue and white box. Use locking tab on box and tape box closed  securely. The blue and white box has prepaid postage on it. Please place it in the mailbox as  soon as possible. Your physician should have your test results approximately 7 days after the  monitor has been mailed back to [facility].  Call [facility] Customer Care at [phone] if you have questions regarding  your ZIO XT patch monitor. Call them immediately if you see an orange light blinking on your  monitor.  If your monitor falls off in less than 4 days, contact our Monitor department at [phone].  If your monitor becomes loose or falls off after 4 days call [employer] at [phone] for  suggestions on securing your monitor

## 2022-02-14 NOTE — Telephone Encounter (Signed)
Called and rescheduled patients sleep consult from Dr. Craige Cotta to TP for sooner availability.  Nothing further needed.

## 2022-02-20 ENCOUNTER — Other Ambulatory Visit: Payer: Self-pay

## 2022-02-20 ENCOUNTER — Encounter: Payer: Self-pay | Admitting: Adult Health

## 2022-02-20 ENCOUNTER — Institutional Professional Consult (permissible substitution): Payer: HMO | Admitting: Adult Health

## 2022-02-20 ENCOUNTER — Ambulatory Visit (INDEPENDENT_AMBULATORY_CARE_PROVIDER_SITE_OTHER): Payer: HMO | Admitting: Adult Health

## 2022-02-20 VITALS — BP 100/50 | HR 88 | Temp 98.0°F | Ht 62.0 in | Wt 213.0 lb

## 2022-02-20 DIAGNOSIS — G4733 Obstructive sleep apnea (adult) (pediatric): Secondary | ICD-10-CM | POA: Diagnosis not present

## 2022-02-20 DIAGNOSIS — Z6838 Body mass index (BMI) 38.0-38.9, adult: Secondary | ICD-10-CM | POA: Diagnosis not present

## 2022-02-20 NOTE — Progress Notes (Signed)
? ?@Patient  ID: Anne Juarez, female    DOB: 02-02-1941, 81 y.o.   MRN: [id number] ? ?Chief Complaint  ?Patient presents with  ? Consult  ? ? ?Referring provider: ?Michael Boston, MD ? ?HPI: ?81 year old female former smoker with a history of obstructive sleep apnea and OHS, chronic bronchitis and lung nodule. ?Reestablish February 20, 2022 ? ?TEST/EVENTS :  ?Pulmonary tests ?PFT 04/23/13 >> FEV1 2.0 (109%), FEV1% 79, TLC 4.48 (109%), DLCO 95%  ?CT chest 05/14/13 >> mild centrilobular emphysema, coronary calcifications, 6 mm RML nodule ?FeNO 11/21/16 >> 30 ?Spirometry 12/105/17 >> FEV1 1.71 (91%), FEV1% 74 ? CT chest 11/28/16 >> no change 4 mm RML and RLL nodules, atherosclerosis ?  ?Sleep tests ?ONO with RA 04/27/13 >> Test time 9 hrs 28 min.  Baseline SpO2 97%, low SpO2 54%.  Spent 166 min with SpO2 < 88% ?PSG 08/04/13 >> AHI 59.6, SpO2 low 83%, PLMI 0. ?BiPAP 10/22/13 >> BiPAP 12/8 cm H2O >> AHI 1.3, +R. PLMI 42. ?ONO with BiPAP 03/26/14 >> Test tim 5 hrs 49 min. Mean SpO2 93%, low SpO2 85%. Spent 2 min with SpO2 < 89%. ?BiPAP 07/17/14 to 10/14/14 >> used on 89 of 90 nights with average 6 hrs 45 minutes. Average AHI 1.1 with BiPAP 12/8 cm H2O. ? ?02/20/2022 Sleep Consult  ?Patient presents for a sleep consult today.  Patient was last seen in the office in May 31, 2017.  She has a known history of severe sleep apnea with OHS.  Previous sleep study 2014 showed severe sleep apnea with AHI 59.6/hour and SPO2 low at 83%.  Patient had been on BiPAP.  ?BiPAP stopped working 1 year ago.  Since stopping BiPAP she is very sleepy has restless sleep and feels like she snores.  Epworth score is 12.  Gets very sleepy with an activity.  And after eating lunch. ?Typically goes to bed about 10:30 PM takes sometimes more than an hour to go to sleep.  Feels like she gets up every 2 hours to go to the bathroom or wakes up.  And typically gets up about 7 AM.  Patient is retired.  Typically drinks about 2 cups of coffee in the morning.  Before her  BiPAP broke patient says she wore her BiPAP at least 8 hours every night could not sleep without it.  Denies any symptoms suspicious for cataplexy or sleep paralysis. ?She wants to use [facility] for her DME.  ? ?She is following with cardiology for palpitations.  Has an upcoming echo planned for next week.  Also Holter monitor. ? ?Has a history of chronic bronchitis.  No recent flare.  Does not take Advair on a regular basis.  No albuterol use.  Gets short of breath with activities.  Has low activity tolerance.  Is sedentary. ? ?Family history mother had congestive heart failure, sister had colon cancer.  Brother had liver cancer.  Paternal aunt had stomach cancer.  Father had prostate cancer. ? ?Surgical history none ? ?Social history patient is retired.  She is widowed.  Lives alone.  Quit smoking in 2003.  No alcohol.  No drug use. ? ? ?Allergies  ?Allergen Reactions  ? Adhesive [Tape] Itching and Other (See Comments)  ?  Itchy blisters result  ? Codeine Itching  ? ? ?Immunization History  ?Administered Date(s) Administered  ? Influenza Split 10/18/2012, 09/22/2013  ? Influenza,inj,Quad PF,6+ Mos 10/06/2014, 10/31/2016  ? Pneumococcal Conjugate-13 10/12/2014  ? Pneumococcal Polysaccharide-23 12/18/2010  ? ? ?Past  Medical History:  ?Diagnosis Date  ? Anxiety   ? BMI 39.0-39.9,adult   ? COPD (chronic obstructive pulmonary disease) ([place])   ? Depression   ? Diabetes mellitus without complication ([place])   ? DM (diabetes mellitus) ([place])   ? Fibromyalgia   ? Hyperlipidemia   ? Hypertension   ? Obesity   ? OSA (obstructive sleep apnea) 07/01/2013  ? Osteoarthritis (arthritis due to wear and tear of joints)   ? neck and knees,pelvic bone and left shoulder  ? Palpitations   ? Spondylolysis   ? Synovial cyst   ? ? ?Tobacco History: ?Social History  ? ?Tobacco Use  ?Smoking Status Former  ? Packs/day: 2.50  ? Years: 45.00  ? Pack years: 112.50  ? Types: Cigarettes  ? Quit date: 03/18/2002  ? Years since  quitting: 19.9  ?Smokeless Tobacco Never  ? ?Counseling given: Not Answered ? ? ?Outpatient Medications Prior to Visit  ?Medication Sig Dispense Refill  ? albuterol (VENTOLIN HFA) 108 (90 Base) MCG/ACT inhaler Inhale 2 puffs into the lungs every 6 (six) hours as needed for wheezing.    ? amLODipine (NORVASC) 5 MG tablet Take 5 mg by mouth daily.    ? atorvastatin (LIPITOR) 40 MG tablet Take 40 mg by mouth daily.    ? B Complex Vitamins (VITAMIN-B COMPLEX) TABS Take 1 tablet by mouth daily.    ? cyclobenzaprine (FLEXERIL) 10 MG tablet Take 10 mg by mouth 3 (three) times daily as needed for muscle spasms.     ? Fluticasone-Salmeterol (ADVAIR) 100-50 MCG/DOSE AEPB Inhale 1 puff into the lungs 2 (two) times daily. 180 each 3  ? Multiple Vitamins-Minerals (CENTRUM SILVER PO) Take 1 tablet by mouth daily after breakfast.     ? oxyCODONE-acetaminophen (PERCOCET) 5-325 MG tablet Take 1 tablet by mouth every 6 (six) hours as needed for moderate pain or severe pain. 6 tablet 0  ? Potassium 99 MG TABS Take 396 mg by mouth daily.    ? pyridoxine (B-6) 100 MG tablet Take 100 mg by mouth daily.    ? telmisartan-hydrochlorothiazide (MICARDIS HCT) 80-25 MG per tablet Take 1 tablet by mouth daily.    ? vitamin C (ASCORBIC ACID) 500 MG tablet Take 500 mg by mouth daily.    ? vitamin E 400 UNIT capsule Take 400 Units by mouth daily.    ? ?No facility-administered medications prior to visit.  ? ? ? ?Review of Systems:  ? ?Constitutional:   No  weight loss, night sweats,  Fevers, chills, fatigue, or  lassitude. ? ?HEENT:   No headaches,  Difficulty swallowing,  Tooth/dental problems, or  Sore throat,  ?              No sneezing, itching, ear ache, nasal congestion, post nasal drip,  ? ?CV:  No chest pain,  Orthopnea, PND, swelling in lower extremities, anasarca, dizziness, syncope.  ? ?GI  No heartburn, indigestion, abdominal pain, nausea, vomiting, diarrhea, change in bowel habits, loss of appetite, bloody stools.  ? ?Resp:   No excess  mucus, no productive cough,  No non-productive cough,  No coughing up of blood.  No change in color of mucus.  No wheezing.  No chest wall deformity ? ?Skin: no rash or lesions. ? ?GU: no dysuria, change in color of urine, no urgency or frequency.  No flank pain, no hematuria  ? ?MS:  No joint pain or swelling.  No decreased range of motion.  No back pain. ? ? ? ?  Physical Exam ? ? ? ?GEN: A/Ox3; pleasant , NAD, well nourished  ?  ?HEENT:  [place]/AT,   NOSE-clear, THROAT-clear, no lesions, no postnasal drip or exudate noted.  Class II-III MP airway ? ?NECK:  Supple w/ fair ROM; no JVD; normal carotid impulses w/o bruits; no thyromegaly or nodules palpated; no lymphadenopathy.   ? ?RESP  Clear  P & A; w/o, wheezes/ rales/ or rhonchi. no accessory muscle use, no dullness to percussion ? ?CARD:  RRR, no m/r/g, no peripheral edema, pulses intact, no cyanosis or clubbing. ? ?GI:   Soft & nt; nml bowel sounds; no organomegaly or masses detected.  ? ?Musco: Warm bil, no deformities or joint swelling noted.  ? ?Neuro: alert, no focal deficits noted.   ? ?Skin: Warm, no lesions or rashes ? ? ? ?Lab Results: ? ?CBC ? ? ?BNP ?No results found for: BNP ? ?ProBNP ? ? ?Imaging: ?No results found. ? ? ? ?PFT Results Latest Ref Rng & Units 05/31/2017 04/23/2013  ?FVC-Pre L 2.42 -  ?FVC-Predicted Pre % 104 100  ?FVC-Post L 2.43 2.54  ?FVC-Predicted Post % 105 104  ?Pre FEV1/FVC % % 77 76  ?Post FEV1/FCV % % 77 79  ?FEV1-Pre L 1.85 1.86  ?FEV1-Predicted Pre % 107 101  ?FEV1-Post L 1.86 2.00  ?DLCO uncorrected ml/min/mmHg 17.72 18.04  ?DLCO UNC% % 92 95  ?DLCO corrected ml/min/mmHg 18.31 -  ?DLCO COR %Predicted % 95 -  ?DLVA Predicted % 91 92  ?TLC L 5.61 4.88  ?TLC % Predicted % 125 109  ?RV % Predicted % 136 128  ? ? ?Lab Results  ?Component Value Date  ? NITRICOXIDE 30 11/21/2016  ? ? ? ? ? ? ?Assessment & Plan:  ? ?No problem-specific Assessment & Plan notes found for this encounter. ? ? ? ? ?Rexene Edison, NP ?02/20/2022 ? ?

## 2022-02-20 NOTE — Patient Instructions (Signed)
Restart BIPAP At bedtime   ?Order for new BIPAP machine same setting  ?Work on healthy weight .  ?Do not drive if sleepy  ?Folllow up with Dr. Craige Cotta  or Meshelle Holness In 3 months and As needed   ?

## 2022-02-23 DIAGNOSIS — E669 Obesity, unspecified: Secondary | ICD-10-CM | POA: Insufficient documentation

## 2022-02-23 NOTE — Assessment & Plan Note (Signed)
Previous sleep study August 2014 showed severe sleep apnea with an AHI of 59.6.  Patient has been well controlled on her BiPAP up until the last year when her BiPAP broke.  Patient reports excellent compliance.  She has significant symptom burden since being off of her BiPAP.  We will restart BiPAP.  Order for new machine.  We will continue on same settings with BiPAP auto, IPAP 12, EPAP 8 cm H2O. ? ?Plan  ?Patient Instructions  ?Restart BIPAP At bedtime   ?Order for new BIPAP machine same setting  ?Work on healthy weight .  ?Do not drive if sleepy  ?Folllow up with Dr. Halford Chessman  or Adelena Desantiago In 3 months and As needed   ?  ? ?

## 2022-02-23 NOTE — Assessment & Plan Note (Signed)
Healthy weight loss discussed 

## 2022-02-28 ENCOUNTER — Institutional Professional Consult (permissible substitution): Payer: HMO | Admitting: Adult Health

## 2022-03-02 ENCOUNTER — Ambulatory Visit (HOSPITAL_COMMUNITY): Payer: HMO | Attending: Cardiology

## 2022-03-02 ENCOUNTER — Other Ambulatory Visit: Payer: Self-pay

## 2022-03-02 DIAGNOSIS — R5383 Other fatigue: Secondary | ICD-10-CM | POA: Insufficient documentation

## 2022-03-02 DIAGNOSIS — R002 Palpitations: Secondary | ICD-10-CM | POA: Diagnosis not present

## 2022-03-02 DIAGNOSIS — I700 Atherosclerosis of aorta: Secondary | ICD-10-CM | POA: Insufficient documentation

## 2022-03-02 DIAGNOSIS — R0602 Shortness of breath: Secondary | ICD-10-CM | POA: Insufficient documentation

## 2022-03-02 DIAGNOSIS — Z87891 Personal history of nicotine dependence: Secondary | ICD-10-CM | POA: Diagnosis not present

## 2022-03-02 DIAGNOSIS — E669 Obesity, unspecified: Secondary | ICD-10-CM | POA: Insufficient documentation

## 2022-03-02 DIAGNOSIS — I119 Hypertensive heart disease without heart failure: Secondary | ICD-10-CM | POA: Diagnosis not present

## 2022-03-02 DIAGNOSIS — E119 Type 2 diabetes mellitus without complications: Secondary | ICD-10-CM | POA: Diagnosis not present

## 2022-03-02 DIAGNOSIS — R9431 Abnormal electrocardiogram [ECG] [EKG]: Secondary | ICD-10-CM | POA: Diagnosis not present

## 2022-03-02 DIAGNOSIS — I080 Rheumatic disorders of both mitral and aortic valves: Secondary | ICD-10-CM | POA: Diagnosis not present

## 2022-03-02 DIAGNOSIS — E785 Hyperlipidemia, unspecified: Secondary | ICD-10-CM | POA: Diagnosis not present

## 2022-03-02 DIAGNOSIS — J449 Chronic obstructive pulmonary disease, unspecified: Secondary | ICD-10-CM | POA: Insufficient documentation

## 2022-03-02 DIAGNOSIS — G4730 Sleep apnea, unspecified: Secondary | ICD-10-CM | POA: Insufficient documentation

## 2022-03-02 DIAGNOSIS — R609 Edema, unspecified: Secondary | ICD-10-CM | POA: Diagnosis not present

## 2022-03-02 LAB — ECHOCARDIOGRAM COMPLETE
Area-P 1/2: 2.74 cm2
S' Lateral: 1.5 cm

## 2022-03-03 DIAGNOSIS — R002 Palpitations: Secondary | ICD-10-CM | POA: Diagnosis not present

## 2022-03-06 ENCOUNTER — Telehealth: Payer: Self-pay | Admitting: Adult Health

## 2022-03-06 NOTE — Telephone Encounter (Signed)
Attempted to call patient but she did not answer. Left voicemail for her to call us back.  ?

## 2022-03-06 NOTE — Telephone Encounter (Signed)
Spoke with Minerva Areola at [facility] to inquire if they had a update on the equipment. Minerva Areola stated Marice Potter does not Paramedic but then Minerva Areola was informed that ordered had been placed and accepted on 02/21/22 for Bi-Pap by [facility]. Eric asked to speak to his store Production designer, theatre/television/film and return our call when he received more information. Minerva Areola was given triage's phone number and RN's name. Will await return call. Pt was updated.  ?

## 2022-03-06 NOTE — Telephone Encounter (Signed)
PCCs do you know if [facility] carries Bi -Paps? The order is for a Bi - Pap if not can we switch DME to [facility]?  ?

## 2022-03-07 ENCOUNTER — Institutional Professional Consult (permissible substitution): Payer: HMO | Admitting: Adult Health

## 2022-03-08 NOTE — Telephone Encounter (Signed)
I called [facility] on 3/6 and spoke to [place] because was placed at that time for bipap and pt requested [facility].  Chanetta Marshall stated they do not carry bi-paps but they order from [facility] and machines are received in about 2 weeks.  Pt has to pay for it and then get reimbursed from insurance.  I faxed over the order to them at that time.  I called today and spoke to [facility].  She states Chanetta Marshall is not there today but will be back in tomorrow.  She gave same info - they do not have resp therapist and they recommend pt's go to [facility].  She said for me to call back tomorrow and ask Chanetta Marshall about this order.  ?

## 2022-03-09 NOTE — Telephone Encounter (Signed)
[facility] & spoke to [facility] & verified order is there pt just needs to call them.  I called pt & she states she has decided she wants to use [facility] since she has used them previously and she can't pay for machine up front.  I told her I would send to [facility].  I spoke to Cla[place] and verified this order can be used even though has [employer] on it.  Order sent to [facility].  Nothing further needed.  ?

## 2022-03-24 DIAGNOSIS — R002 Palpitations: Secondary | ICD-10-CM | POA: Diagnosis not present

## 2022-03-27 ENCOUNTER — Telehealth: Payer: Self-pay

## 2022-03-27 MED ORDER — METOPROLOL TARTRATE 25 MG PO TABS: 25.0000 mg | ORAL_TABLET | Freq: Every day | ORAL | 3 refills | Status: DC

## 2022-03-27 NOTE — Telephone Encounter (Signed)
-----   Message from Vesta Mixer, MD sent at 03/26/2022  7:02 AM EDT ----- ?Frequent episodes of very brief nonsustained SVT ?I suspect these are being exacerbated by her sleep apnea (her CPAP is not functioning at present) ?Lets try Toprol XL 25 mg a day to see if this helps the palpitations in the meantime.   When she gets a new CPAP mask, she may not need the toprol ?

## 2022-03-27 NOTE — Telephone Encounter (Signed)
Spoke directly with patient and relayed results. Pt agrees to plan to try Metoprolol Succinate 25mg  daily. Understands to continue to monitor blood pressure results and states that she knows she may not need this once her CPAP machine is back and functioning, but states she doesn't know when that will be. ?

## 2022-03-28 NOTE — Telephone Encounter (Signed)
Pt saw TP for a sleep consult 3/6 and cpap order was taken care of during that OV. Nothing further needed. ?

## 2022-04-03 ENCOUNTER — Institutional Professional Consult (permissible substitution): Payer: HMO | Admitting: Pulmonary Disease

## 2022-04-22 NOTE — Progress Notes (Signed)
Cardiology Office Note:    Date:  04/25/2022   ID:  [name], DOB 1941/06/18, MRN [id number]  PCP:  Melida Quitter, MD   [facility] Providers Cardiologist:  Kristeen Miss, MD     Referring MD: Melida Quitter, MD   Chief Complaint: follow-up palpitations  History of Present Illness:    Anne Juarez is a pleasant 81 y.o. female with a hx of hypertension, hyperlipidemia, obesity, severe OSA, and COPD.   Previously seen by Dr. Nadara Eaton. She established care with our group upon referral from her PCP for evaluation of palpitations and chest pain and irregularities seen on EKG.  She was seen by Dr. Elease Hashimoto on 02/14/2022 at which time she reported she was feeling well, no chest pain, chronic dyspnea related to COPD. Active by walking her dog several times a day, housework, yard work including weed eating trimming hedges.  Severe OSA, CPAP machine is broken and states she needs to see her sleep doctor. EKG reveals small Q waves in leads II, III and aVF, nonspecific ST changes in her lateral leads. BP is soft.  Cardiac monitor revealed frequent episodes of very brief nonsustained SVT, felt to be exacerbated by her sleep apnea.  She was advised to start Toprol XL 25 mg a day and to follow-up with her sleep doctor.  Echo revealed hyperdynamic LV function, moderate LVH, and trivial MR. She was advised to follow-up in 2 to 3 months.  Today, she is here alone for follow-up.  Reports 3 days ago she started to have upper chest congestion, chills and aches.  Started taking guaifenesin and is now coughing up phlegm and feels starting to feel better, still a little tired. Thinks symptoms may have come from doing yard work and exposure to pollen. She continues to be active working outside in her yard, doing her house work, and her shopping.  Takes her dog for short walk 4 times a day.  Is limited from walking long distances by her knees. Order for CPAP equipment has been placed with [employer]. She called a few days ago  and the person could not give her a time frame for when her equipment will arrive. They are to call her when equipment arrives to arrange time to come to her home. Reports BP at home is 130-140 mmHg.  She does not monitor at a consistent time of day. Palpitations are less bothersome since starting metoprolol. She denies chest pain, shortness of breath, lower extremity edema, fatigue, melena, hematuria, hemoptysis, diaphoresis, weakness, presyncope, syncope, orthopnea, and PND.  Past Medical History:  Diagnosis Date   Anxiety    BMI 39.0-39.9,adult    COPD (chronic obstructive pulmonary disease) (HCC)    Depression    Diabetes mellitus without complication (HCC)    DM (diabetes mellitus) (HCC)    Fibromyalgia    Hyperlipidemia    Hypertension    Obesity    OSA (obstructive sleep apnea) 07/01/2013   Osteoarthritis (arthritis due to wear and tear of joints)    neck and knees,pelvic bone and left shoulder   Palpitations    Spondylolysis    Synovial cyst     Past Surgical History:  Procedure Laterality Date   COLONOSCOPY     LAPAROSCOPIC TUBAL LIGATION     PILONIDAL CYST EXCISION     POLYPECTOMY     TONSILLECTOMY      Current Medications: Current Meds  Medication Sig   albuterol (VENTOLIN HFA) 108 (90 Base) MCG/ACT inhaler Inhale 2 puffs into  the lungs every 6 (six) hours as needed for wheezing.   amLODipine (NORVASC) 5 MG tablet Take 5 mg by mouth daily.   atorvastatin (LIPITOR) 40 MG tablet Take 40 mg by mouth daily.   B Complex Vitamins (VITAMIN-B COMPLEX) TABS Take 1 tablet by mouth daily.   calcium-vitamin D (OSCAL WITH D) 500-5 MG-MCG tablet Take 1 tablet by mouth daily with breakfast.   Fluticasone-Salmeterol (ADVAIR) 100-50 MCG/DOSE AEPB Inhale 1 puff into the lungs 2 (two) times daily.   L-Lysine 1000 MG TABS Take 1 tablet by mouth daily at 6 (six) AM.   metoprolol tartrate (LOPRESSOR) 25 MG tablet Take 1 tablet (25 mg total) by mouth daily.   Multiple Vitamins-Minerals  (CENTRUM SILVER PO) Take 1 tablet by mouth daily after breakfast.    Omega-3 1000 MG CAPS Take 1 capsule by mouth daily at 6 (six) AM.   pyridoxine (B-6) 100 MG tablet Take 100 mg by mouth daily.   telmisartan-hydrochlorothiazide (MICARDIS HCT) 80-25 MG per tablet Take 1 tablet by mouth daily.   Turmeric 500 MG CAPS Take 1 capsule by mouth daily.   vitamin C (ASCORBIC ACID) 500 MG tablet Take 500 mg by mouth daily.   vitamin E 400 UNIT capsule Take 400 Units by mouth daily.   zinc gluconate 50 MG tablet Take 50 mg by mouth daily.     Allergies:   Adhesive [tape] and Codeine   Social History   Socioeconomic History   Marital status: Married    Spouse name: Not on file   Number of children: Not on file   Years of education: Not on file   Highest education level: Not on file  Occupational History   Not on file  Tobacco Use   Smoking status: Former    Packs/day: 2.50    Years: 45.00    Pack years: 112.50    Types: Cigarettes    Quit date: 03/18/2002    Years since quitting: 20.1   Smokeless tobacco: Never  Substance and Sexual Activity   Alcohol use: No   Drug use: No   Sexual activity: Not on file  Other Topics Concern   Not on file  Social History Narrative   Not on file   Social Determinants of Health   Financial Resource Strain: Not on file  Food Insecurity: Not on file  Transportation Needs: Not on file  Physical Activity: Not on file  Stress: Not on file  Social Connections: Not on file     Family History: The patient's family history includes Colon cancer in her sister; Liver cancer in her brother; Stomach cancer in her paternal aunt. There is no history of Esophageal cancer or Rectal cancer.  ROS:   Please see the history of present illness.      All other systems reviewed and are negative.  Labs/Other Studies Reviewed:    The following studies were reviewed today:  Cardiac monitor 03/24/22  Sinus rhythm Frequent episodes of nonsustained SVT No  symptoms reported during monitor time   Per Dr. Elease Hashimoto: Frequent episodes of very brief nonsustained SVT I suspect these are being exacerbated by her sleep apnea (her CPAP is not functioning at present) Lets try Toprol XL 25 mg a day to see if this helps the palpitations in the meantime.   When she gets a new CPAP mask, she may not need the toprol   Echo 03/02/22   1. Left ventricular ejection fraction, by estimation, is >75%. The left  ventricle has  hyperdynamic function. The left ventricle has no regional  wall motion abnormalities. There is moderate concentric left ventricular  hypertrophy. Left ventricular  diastolic parameters are indeterminate.   2. Right ventricular systolic function is normal. The right ventricular  size is normal. There is normal pulmonary artery systolic pressure.   3. Left atrial size was mild to moderately dilated.   4. Right atrial size was moderately dilated.   5. The mitral valve was not well visualized. Trivial mitral valve  regurgitation. No evidence of mitral stenosis. Severe mitral annular  calcification.   6. The aortic valve was not well visualized. There is moderate  calcification of the aortic valve. There is mild thickening of the aortic  valve. Aortic valve regurgitation is trivial. Aortic valve sclerosis is  present, with no evidence of aortic valve  stenosis.   7. The inferior vena cava is normal in size with greater than 50%  respiratory variability, suggesting right atrial pressure of 3 mmHg.   Comparison(s): No significant change from prior study.   Recent Labs: No results found for requested labs within last 8760 hours.  Recent Lipid Panel No results found for: CHOL, TRIG, HDL, CHOLHDL, VLDL, LDLCALC, LDLDIRECT   Risk Assessment/Calculations:       Physical Exam:    VS:  BP (!) 144/72   Pulse 83   Ht 5\' 2"  (1.575 m)   Wt 212 lb (96.2 kg)   SpO2 92%   BMI 38.78 kg/m     Wt Readings from Last 3 Encounters:  04/25/22 212  lb (96.2 kg)  02/20/22 213 lb (96.6 kg)  02/14/22 212 lb (96.2 kg)     GEN:  Well developed, obese female in no acute distress HEENT: Normal NECK: No JVD; No carotid bruits CARDIAC: RRR, no murmurs, rubs, gallops RESPIRATORY:  Clear to auscultation without rales, wheezing or rhonchi  ABDOMEN: Soft, non-tender, non-distended MUSCULOSKELETAL:  No edema; No deformity. 2+ pedal pulses, equal bilaterally SKIN: Warm and dry NEUROLOGIC:  Alert and oriented x 3 PSYCHIATRIC:  Normal affect   EKG:  EKG is not ordered today.   Diagnoses:    1. Palpitations   2. Essential hypertension   3. Obesity (BMI 30-39.9)   4. Hyperlipidemia LDL goal <70    Assessment and Plan:     Palpitations:  Cardiac monitor revealed frequent episodes of very brief nonsustained SVT felt to be exacerbated by sleep apnea. Continues to await CPAP equipment. Feeling better on metoprolol. As noted below, would favor change to carvedilol if BP remains elevated.   Hypertension: BP is above goal. Moderate LVH on echo 02/2022. She monitors at home but not on a consistent basis. Asked her to monitor for 2 weeks at least 2 hours after taking medication and call us to report. If BP remains elevated would favor changing her metoprolol to carvedilol for better BP control.   Obesity: We discussed the impact of her weight on her cardiovascular health as well as her joint pain, particularly in her knees. She admits to high sugar diet. Encouraged her to make one change in reducing intake of sugar and to work on incorporating more whole grains, fruits, and vegetables into her diet.   Hyperlipidemia LDL goal < 70: LDL 79 12/13/21. Encouraged dietary changes to reduce intake of sugar and simple carbohydrates and to increase mostly plant based diet low in saturated fat. Continue atorvastatin.   Disposition: 6 months with Dr. Elease Hashimoto or APP   Medication Adjustments/Labs and Tests Ordered: Current medicines are  reviewed at length with  the patient today.  Concerns regarding medicines are outlined above.  No orders of the defined types were placed in this encounter.  No orders of the defined types were placed in this encounter.   Patient Instructions  Medication Instructions:   Your physician recommends that you continue on your current medications as directed. Please refer to the Current Medication list given to you today.   *If you need a refill on your cardiac medications before your next appointment, please call your pharmacy*   Lab Work:  None ordered.   If you have labs (blood work) drawn today and your tests are completely normal, you will receive your results only by: MyChart Message (if you have MyChart) OR A paper copy in the mail If you have any lab test that is abnormal or we need to change your treatment, we will call you to review the results.   Testing/Procedures:  None ordered.   Follow-Up: At [facility], you and your health needs are our priority.  As part of our continuing mission to provide you with exceptional heart care, we have created designated Provider Care Teams.  These Care Teams include your primary Cardiologist (physician) and Advanced Practice Providers (APPs -  Physician Assistants and Nurse Practitioners) who all work together to provide you with the care you need, when you need it.  We recommend signing up for the patient portal called "MyChart".  Sign up information is provided on this After Visit Summary.  MyChart is used to connect with patients for Virtual Visits (Telemedicine).  Patients are able to view lab/test results, encounter notes, upcoming appointments, etc.  Non-urgent messages can be sent to your provider as well.   To learn more about what you can do with MyChart, go to [email or web address].    Your next appointment:   6 month(s)  The format for your next appointment:   In Person  Provider:   None     Other Instructions  If your blood pressure is  consistently above 140/80 X 3 please call office or send mychart message.   Blood Pressure Record Sheet To take your blood pressure, you will need a blood pressure machine. You may be prescribed one, or you can buy a blood pressure machine (blood pressure monitor) at your clinic, drug store, or online. When choosing one, look for these features: An automatic monitor that has an arm cuff. A cuff that wraps snugly, but not too tightly, around your upper arm. You should be able to fit only one finger between your arm and the cuff. A device that stores blood pressure reading results. Do not choose a monitor that measures your blood pressure from your wrist or finger. Follow your health care provider's instructions for how to take your blood pressure. To use this form: Get one reading in the morning (a.m.) before you take any medicines. Get one reading in the evening (p.m.) before supper. Take at least two readings with each blood pressure check. This makes sure the results are correct. Wait 1-2 minutes between measurements. Write down the results in the spaces on this form. Repeat this once a week, or as told by your health care provider. Make a follow-up appointment with your health care provider to discuss the results. Blood pressure log Date: _______________________ a.m. _____________________(1st reading) _____________________(2nd reading) p.m. _____________________(1st reading) _____________________(2nd reading) Date: _______________________ a.m. _____________________(1st reading) _____________________(2nd reading) p.m. _____________________(1st reading) _____________________(2nd reading) Date: _______________________ a.m. _____________________(1st reading) _____________________(2nd reading) p.m. _____________________(1st reading) _____________________(2nd  reading) Date: _______________________ a.m. _____________________(1st reading) _____________________(2nd reading) p.m.  _____________________(1st reading) _____________________(2nd reading) Date: _______________________ a.m. _____________________(1st reading) _____________________(2nd reading) p.m. _____________________(1st reading) _____________________(2nd reading) This information is not intended to replace advice given to you by your health care provider. Make sure you discuss any questions you have with your health care provider. Document Revised: 08/18/2021 Document Reviewed: 08/18/2021 Elsevier Patient Education  2023 Elsevier Inc.   Mediterranean Diet A Mediterranean diet refers to food and lifestyle choices that are based on the traditions of countries located on the [employer] [place]. It focuses on eating more fruits, vegetables, whole grains, beans, nuts, seeds, and heart-healthy fats, and eating less dairy, meat, eggs, and processed foods with added sugar, salt, and fat. This way of eating has been shown to help prevent certain conditions and improve outcomes for people who have chronic diseases, like kidney disease and heart disease. What are tips for following this plan? Reading food labels Check the serving size of packaged foods. For foods such as rice and pasta, the serving size refers to the amount of cooked product, not dry. Check the total fat in packaged foods. Avoid foods that have saturated fat or trans fats. Check the ingredient list for added sugars, such as corn syrup. Shopping  Buy a variety of foods that offer a balanced diet, including: Fresh fruits and vegetables (produce). Grains, beans, nuts, and seeds. Some of these may be available in unpackaged forms or large amounts (in bulk). Fresh seafood. Poultry and eggs. Low-fat dairy products. Buy whole ingredients instead of prepackaged foods. Buy fresh fruits and vegetables in-season from local farmers markets. Buy plain frozen fruits and vegetables. If you do not have access to quality fresh seafood, buy precooked frozen shrimp  or canned fish, such as tuna, salmon, or sardines. Stock your pantry so you always have certain foods on hand, such as olive oil, canned tuna, canned tomatoes, rice, pasta, and beans. Cooking Cook foods with extra-virgin olive oil instead of using butter or other vegetable oils. Have meat as a side dish, and have vegetables or grains as your main dish. This means having meat in small portions or adding small amounts of meat to foods like pasta or stew. Use beans or vegetables instead of meat in common dishes like chili or lasagna. Experiment with different cooking methods. Try roasting, broiling, steaming, and sauting vegetables. Add frozen vegetables to soups, stews, pasta, or rice. Add nuts or seeds for added healthy fats and plant protein at each meal. You can add these to yogurt, salads, or vegetable dishes. Marinate fish or vegetables using olive oil, lemon juice, garlic, and fresh herbs. Meal planning Plan to eat one vegetarian meal one day each week. Try to work up to two vegetarian meals, if possible. Eat seafood two or more times a week. Have healthy snacks readily available, such as: Vegetable sticks with hummus. Greek yogurt. Fruit and nut trail mix. Eat balanced meals throughout the week. This includes: Fruit: 2-3 servings a day. Vegetables: 4-5 servings a day. Low-fat dairy: 2 servings a day. Fish, poultry, or lean meat: 1 serving a day. Beans and legumes: 2 or more servings a week. Nuts and seeds: 1-2 servings a day. Whole grains: 6-8 servings a day. Extra-virgin olive oil: 3-4 servings a day. Limit red meat and sweets to only a few servings a month. Lifestyle  Cook and eat meals together with your family, when possible. Drink enough fluid to keep your urine pale yellow. Be physically active every day. This includes:  Aerobic exercise like running or swimming. Leisure activities like gardening, walking, or housework. Get 7-8 hours of sleep each night. If recommended  by your health care provider, drink red wine in moderation. This means 1 glass a day for nonpregnant women and 2 glasses a day for men. A glass of wine equals 5 oz (150 mL). What foods should I eat? Fruits Apples. Apricots. Avocado. Berries. Bananas. Cherries. Dates. Figs. Grapes. Lemons. Melon. Oranges. Peaches. Plums. Pomegranate. Vegetables Artichokes. Beets. Broccoli. Cabbage. Carrots. Eggplant. Green beans. Chard. Kale. Spinach. Onions. Leeks. Peas. Squash. Tomatoes. Peppers. Radishes. Grains Whole-grain pasta. Brown rice. Bulgur wheat. Polenta. Couscous. Whole-wheat bread. Orpah Cobb. Meats and other proteins Beans. Almonds. Sunflower seeds. Pine nuts. Peanuts. Cod. Salmon. Scallops. Shrimp. Tuna. Tilapia. Clams. Oysters. Eggs. Poultry without skin. Dairy Low-fat milk. Cheese. Greek yogurt. Fats and oils Extra-virgin olive oil. Avocado oil. Grapeseed oil. Beverages Water. Red wine. Herbal tea. Sweets and desserts Greek yogurt with honey. Baked apples. Poached pears. Trail mix. Seasonings and condiments Basil. Cilantro. Coriander. Cumin. Mint. Parsley. Sage. Rosemary. Tarragon. Garlic. Oregano. Thyme. Pepper. Balsamic vinegar. Tahini. Hummus. Tomato sauce. Olives. Mushrooms. The items listed above may not be a complete list of foods and beverages you can eat. Contact a dietitian for more information. What foods should I limit? This is a list of foods that should be eaten rarely or only on special occasions. Fruits Fruit canned in syrup. Vegetables Deep-fried potatoes (french fries). Grains Prepackaged pasta or rice dishes. Prepackaged cereal with added sugar. Prepackaged snacks with added sugar. Meats and other proteins Beef. Pork. Lamb. Poultry with skin. Hot dogs. Tomasa Blase. Dairy Ice cream. Sour cream. Whole milk. Fats and oils Butter. Canola oil. Vegetable oil. Beef fat (tallow). Lard. Beverages Juice. Sugar-sweetened soft drinks. Beer. Liquor and spirits. Sweets and  desserts Cookies. Cakes. Pies. Candy. Seasonings and condiments Mayonnaise. Pre-made sauces and marinades. The items listed above may not be a complete list of foods and beverages you should limit. Contact a dietitian for more information. Summary The Mediterranean diet includes both food and lifestyle choices. Eat a variety of fresh fruits and vegetables, beans, nuts, seeds, and whole grains. Limit the amount of red meat and sweets that you eat. If recommended by your health care provider, drink red wine in moderation. This means 1 glass a day for nonpregnant women and 2 glasses a day for men. A glass of wine equals 5 oz (150 mL). This information is not intended to replace advice given to you by your health care provider. Make sure you discuss any questions you have with your health care provider. Document Revised: 01/09/2020 Document Reviewed: 11/06/2019 Elsevier Patient Education  2023 Elsevier Inc.   Important Information About Sugar         Signed, Levi Aland, NP  04/25/2022 1:03 PM    [facility]

## 2022-04-25 ENCOUNTER — Encounter: Payer: Self-pay | Admitting: Nurse Practitioner

## 2022-04-25 ENCOUNTER — Ambulatory Visit (INDEPENDENT_AMBULATORY_CARE_PROVIDER_SITE_OTHER): Payer: HMO | Admitting: Nurse Practitioner

## 2022-04-25 VITALS — BP 144/72 | HR 83 | Ht 62.0 in | Wt 212.0 lb

## 2022-04-25 DIAGNOSIS — I10 Essential (primary) hypertension: Secondary | ICD-10-CM | POA: Diagnosis not present

## 2022-04-25 DIAGNOSIS — R002 Palpitations: Secondary | ICD-10-CM | POA: Diagnosis not present

## 2022-04-25 DIAGNOSIS — E785 Hyperlipidemia, unspecified: Secondary | ICD-10-CM

## 2022-04-25 DIAGNOSIS — E669 Obesity, unspecified: Secondary | ICD-10-CM

## 2022-04-25 NOTE — Patient Instructions (Addendum)
Medication Instructions:   Your physician recommends that you continue on your current medications as directed. Please refer to the Current Medication list given to you today.   *If you need a refill on your cardiac medications before your next appointment, please call your pharmacy*   Lab Work:  None ordered.   If you have labs (blood work) drawn today and your tests are completely normal, you will receive your results only by: MyChart Message (if you have MyChart) OR A paper copy in the mail If you have any lab test that is abnormal or we need to change your treatment, we will call you to review the results.   Testing/Procedures:  None ordered.   Follow-Up: At [facility], you and your health needs are our priority.  As part of our continuing mission to provide you with exceptional heart care, we have created designated Provider Care Teams.  These Care Teams include your primary Cardiologist (physician) and Advanced Practice Providers (APPs -  Physician Assistants and Nurse Practitioners) who all work together to provide you with the care you need, when you need it.  We recommend signing up for the patient portal called "MyChart".  Sign up information is provided on this After Visit Summary.  MyChart is used to connect with patients for Virtual Visits (Telemedicine).  Patients are able to view lab/test results, encounter notes, upcoming appointments, etc.  Non-urgent messages can be sent to your provider as well.   To learn more about what you can do with MyChart, go to [email or web address].    Your next appointment:   6 month(s)  The format for your next appointment:   In Person  Provider:   None     Other Instructions  If your blood pressure is consistently above 140/80 X 3 please call office or send mychart message.   Blood Pressure Record Sheet To take your blood pressure, you will need a blood pressure machine. You may be prescribed one, or you can buy a blood  pressure machine (blood pressure monitor) at your clinic, drug store, or online. When choosing one, look for these features: An automatic monitor that has an arm cuff. A cuff that wraps snugly, but not too tightly, around your upper arm. You should be able to fit only one finger between your arm and the cuff. A device that stores blood pressure reading results. Do not choose a monitor that measures your blood pressure from your wrist or finger. Follow your health care provider's instructions for how to take your blood pressure. To use this form: Get one reading in the morning (a.m.) before you take any medicines. Get one reading in the evening (p.m.) before supper. Take at least two readings with each blood pressure check. This makes sure the results are correct. Wait 1-2 minutes between measurements. Write down the results in the spaces on this form. Repeat this once a week, or as told by your health care provider. Make a follow-up appointment with your health care provider to discuss the results. Blood pressure log Date: _______________________ a.m. _____________________(1st reading) _____________________(2nd reading) p.m. _____________________(1st reading) _____________________(2nd reading) Date: _______________________ a.m. _____________________(1st reading) _____________________(2nd reading) p.m. _____________________(1st reading) _____________________(2nd reading) Date: _______________________ a.m. _____________________(1st reading) _____________________(2nd reading) p.m. _____________________(1st reading) _____________________(2nd reading) Date: _______________________ a.m. _____________________(1st reading) _____________________(2nd reading) p.m. _____________________(1st reading) _____________________(2nd reading) Date: _______________________ a.m. _____________________(1st reading) _____________________(2nd reading) p.m. _____________________(1st reading)  _____________________(2nd reading) This information is not intended to replace advice given to you by your health care provider. Make sure  you discuss any questions you have with your health care provider. Document Revised: 08/18/2021 Document Reviewed: 08/18/2021 Elsevier Patient Education  2023 Elsevier Inc.   Mediterranean Diet A Mediterranean diet refers to food and lifestyle choices that are based on the traditions of countries located on the [employer]. It focuses on eating more fruits, vegetables, whole grains, beans, nuts, seeds, and heart-healthy fats, and eating less dairy, meat, eggs, and processed foods with added sugar, salt, and fat. This way of eating has been shown to help prevent certain conditions and improve outcomes for people who have chronic diseases, like kidney disease and heart disease. What are tips for following this plan? Reading food labels Check the serving size of packaged foods. For foods such as rice and pasta, the serving size refers to the amount of cooked product, not dry. Check the total fat in packaged foods. Avoid foods that have saturated fat or trans fats. Check the ingredient list for added sugars, such as corn syrup. Shopping  Buy a variety of foods that offer a balanced diet, including: Fresh fruits and vegetables (produce). Grains, beans, nuts, and seeds. Some of these may be available in unpackaged forms or large amounts (in bulk). Fresh seafood. Poultry and eggs. Low-fat dairy products. Buy whole ingredients instead of prepackaged foods. Buy fresh fruits and vegetables in-season from local farmers markets. Buy plain frozen fruits and vegetables. If you do not have access to quality fresh seafood, buy precooked frozen shrimp or canned fish, such as tuna, salmon, or sardines. Stock your pantry so you always have certain foods on hand, such as olive oil, canned tuna, canned tomatoes, rice, pasta, and beans. Cooking Cook foods with  extra-virgin olive oil instead of using butter or other vegetable oils. Have meat as a side dish, and have vegetables or grains as your main dish. This means having meat in small portions or adding small amounts of meat to foods like pasta or stew. Use beans or vegetables instead of meat in common dishes like chili or lasagna. Experiment with different cooking methods. Try roasting, broiling, steaming, and sauting vegetables. Add frozen vegetables to soups, stews, pasta, or rice. Add nuts or seeds for added healthy fats and plant protein at each meal. You can add these to yogurt, salads, or vegetable dishes. Marinate fish or vegetables using olive oil, lemon juice, garlic, and fresh herbs. Meal planning Plan to eat one vegetarian meal one day each week. Try to work up to two vegetarian meals, if possible. Eat seafood two or more times a week. Have healthy snacks readily available, such as: Vegetable sticks with hummus. Greek yogurt. Fruit and nut trail mix. Eat balanced meals throughout the week. This includes: Fruit: 2-3 servings a day. Vegetables: 4-5 servings a day. Low-fat dairy: 2 servings a day. Fish, poultry, or lean meat: 1 serving a day. Beans and legumes: 2 or more servings a week. Nuts and seeds: 1-2 servings a day. Whole grains: 6-8 servings a day. Extra-virgin olive oil: 3-4 servings a day. Limit red meat and sweets to only a few servings a month. Lifestyle  Cook and eat meals together with your family, when possible. Drink enough fluid to keep your urine pale yellow. Be physically active every day. This includes: Aerobic exercise like running or swimming. Leisure activities like gardening, walking, or housework. Get 7-8 hours of sleep each night. If recommended by your health care provider, drink red wine in moderation. This means 1 glass a day for nonpregnant women and 2 glasses  a day for men. A glass of wine equals 5 oz (150 mL). What foods should I  eat? Fruits Apples. Apricots. Avocado. Berries. Bananas. Cherries. Dates. Figs. Grapes. Lemons. Melon. Oranges. Peaches. Plums. Pomegranate. Vegetables Artichokes. Beets. Broccoli. Cabbage. Carrots. Eggplant. Green beans. Chard. Kale. Spinach. Onions. Leeks. Peas. Squash. Tomatoes. Peppers. Radishes. Grains Whole-grain pasta. Brown rice. Bulgur wheat. Polenta. Couscous. Whole-wheat bread. Orpah Cobb. Meats and other proteins Beans. Almonds. Sunflower seeds. Pine nuts. Peanuts. Cod. Salmon. Scallops. Shrimp. Tuna. Tilapia. Clams. Oysters. Eggs. Poultry without skin. Dairy Low-fat milk. Cheese. Greek yogurt. Fats and oils Extra-virgin olive oil. Avocado oil. Grapeseed oil. Beverages Water. Red wine. Herbal tea. Sweets and desserts Greek yogurt with honey. Baked apples. Poached pears. Trail mix. Seasonings and condiments Basil. Cilantro. Coriander. Cumin. Mint. Parsley. Sage. Rosemary. Tarragon. Garlic. Oregano. Thyme. Pepper. Balsamic vinegar. Tahini. Hummus. Tomato sauce. Olives. Mushrooms. The items listed above may not be a complete list of foods and beverages you can eat. Contact a dietitian for more information. What foods should I limit? This is a list of foods that should be eaten rarely or only on special occasions. Fruits Fruit canned in syrup. Vegetables Deep-fried potatoes (french fries). Grains Prepackaged pasta or rice dishes. Prepackaged cereal with added sugar. Prepackaged snacks with added sugar. Meats and other proteins Beef. Pork. Lamb. Poultry with skin. Hot dogs. Tomasa Blase. Dairy Ice cream. Sour cream. Whole milk. Fats and oils Butter. Canola oil. Vegetable oil. Beef fat (tallow). Lard. Beverages Juice. Sugar-sweetened soft drinks. Beer. Liquor and spirits. Sweets and desserts Cookies. Cakes. Pies. Candy. Seasonings and condiments Mayonnaise. Pre-made sauces and marinades. The items listed above may not be a complete list of foods and beverages you should  limit. Contact a dietitian for more information. Summary The Mediterranean diet includes both food and lifestyle choices. Eat a variety of fresh fruits and vegetables, beans, nuts, seeds, and whole grains. Limit the amount of red meat and sweets that you eat. If recommended by your health care provider, drink red wine in moderation. This means 1 glass a day for nonpregnant women and 2 glasses a day for men. A glass of wine equals 5 oz (150 mL). This information is not intended to replace advice given to you by your health care provider. Make sure you discuss any questions you have with your health care provider. Document Revised: 01/09/2020 Document Reviewed: 11/06/2019 Elsevier Patient Education  2023 Elsevier Inc.   Important Information About Sugar

## 2022-04-28 DIAGNOSIS — Z6839 Body mass index (BMI) 39.0-39.9, adult: Secondary | ICD-10-CM | POA: Diagnosis not present

## 2022-04-28 DIAGNOSIS — E1159 Type 2 diabetes mellitus with other circulatory complications: Secondary | ICD-10-CM | POA: Diagnosis not present

## 2022-04-28 DIAGNOSIS — J441 Chronic obstructive pulmonary disease with (acute) exacerbation: Secondary | ICD-10-CM | POA: Diagnosis not present

## 2022-04-28 DIAGNOSIS — M797 Fibromyalgia: Secondary | ICD-10-CM | POA: Diagnosis not present

## 2022-04-28 DIAGNOSIS — F339 Major depressive disorder, recurrent, unspecified: Secondary | ICD-10-CM | POA: Diagnosis not present

## 2022-04-28 DIAGNOSIS — N182 Chronic kidney disease, stage 2 (mild): Secondary | ICD-10-CM | POA: Diagnosis not present

## 2022-04-28 DIAGNOSIS — I1310 Hypertensive heart and chronic kidney disease without heart failure, with stage 1 through stage 4 chronic kidney disease, or unspecified chronic kidney disease: Secondary | ICD-10-CM | POA: Diagnosis not present

## 2022-05-31 DIAGNOSIS — R0689 Other abnormalities of breathing: Secondary | ICD-10-CM | POA: Diagnosis not present

## 2022-05-31 DIAGNOSIS — R531 Weakness: Secondary | ICD-10-CM | POA: Diagnosis not present

## 2022-05-31 DIAGNOSIS — G4733 Obstructive sleep apnea (adult) (pediatric): Secondary | ICD-10-CM | POA: Diagnosis not present

## 2022-05-31 DIAGNOSIS — R609 Edema, unspecified: Secondary | ICD-10-CM | POA: Diagnosis not present

## 2022-05-31 DIAGNOSIS — I509 Heart failure, unspecified: Secondary | ICD-10-CM | POA: Diagnosis not present

## 2022-06-05 ENCOUNTER — Encounter: Payer: Self-pay | Admitting: Pulmonary Disease

## 2022-06-05 ENCOUNTER — Ambulatory Visit (INDEPENDENT_AMBULATORY_CARE_PROVIDER_SITE_OTHER): Payer: HMO | Admitting: Pulmonary Disease

## 2022-06-05 VITALS — BP 136/56 | HR 80 | Temp 97.9°F | Ht 62.0 in | Wt 215.2 lb

## 2022-06-05 DIAGNOSIS — J4540 Moderate persistent asthma, uncomplicated: Secondary | ICD-10-CM | POA: Diagnosis not present

## 2022-06-05 DIAGNOSIS — G4733 Obstructive sleep apnea (adult) (pediatric): Secondary | ICD-10-CM | POA: Diagnosis not present

## 2022-06-05 NOTE — Progress Notes (Signed)
[facility]  Chief Complaint  Patient presents with   Follow-up    Follow up. Patient says everything is going okay     Past Surgical History:  She  has a past surgical history that includes Laparoscopic tubal ligation; Tonsillectomy; Pilonidal cyst excision; Colonoscopy; and Polypectomy.  Past Medical History:  Anxiety, Depression, DM, HLD, HTN, OA  Constitutional:  BP (!) 136/56 (BP Location: Right Arm, Patient Position: Sitting, Cuff Size: Normal)   Pulse 80   Temp 97.9 F (36.6 C) (Oral)   Ht 5\' 2"  (1.575 m)   Wt 215 lb 3.2 oz (97.6 kg)   SpO2 93%   BMI 39.36 kg/m   Brief Summary:  Anne Juarez is a 81 y.o. female with asthma and obstructive sleep apnea.      Subjective:   I last saw him in 2018.  More recently seen by Tammy Parrett.  Breathing doing well.  Not having much cough or sputum.  Uses advair.  Needs albuterol couple times per week.  Got new Bipap about 1 week ago.  She is sleeping much better.  Still getting used to mask fit.  Isn't needing to use bathroom as much at night.  Physical Exam:   Appearance - well kempt   ENMT - no sinus tenderness, no oral exudate, no LAN, Mallampati 3 airway, no stridor  Respiratory - equal breath sounds bilaterally, no wheezing or rales  CV - s1s2 regular rate and rhythm, no murmurs  Ext - no clubbing, no edema  Skin - no rashes  Psych - normal mood and affect   Pulmonary testing:  PFT 04/23/13 >> FEV1 2.0 (109%), FEV1% 79, TLC 4.48 (109%), DLCO 95%  FeNO 11/21/16 >> 30 Spirometry 12/105/17 >> FEV1 1.71 (91%), FEV1% 74  Chest Imaging:  CT chest 05/14/13 >> mild centrilobular emphysema, coronary calcifications, 6 mm RML nodule CT chest 11/28/16 >> no change 4 mm RML and RLL nodules, atherosclerosis  Sleep Tests:  ONO with RA 04/27/13 >> Test time 9 hrs 28 min.  Baseline SpO2 97%, low SpO2 54%.  Spent 166 min with SpO2 < 88% PSG 08/04/13 >> AHI 59.6, SpO2 low 83%,  PLMI 0. BiPAP 10/22/13 >> BiPAP 12/8 cm H2O >> AHI 1.3, +R. PLMI 42. ONO with BiPAP 03/26/14 >> Test tim 5 hrs 49 min. Mean SpO2 93%, low SpO2 85%. Spent 2 min with SpO2 < 89%. BiPAP 05/30/22 to 05/31/22 >> used on 1 of 1 nights with average 9 hrs 58 min.  Average AHI 0.9 with Bipap 12/8 cm H2O  Cardiac Tests:  Echo 03/02/22 >> EF greater than 75%, mod LVH, mod LA/RA dilation  Social History:  She  reports that she quit smoking about 20 years ago. Her smoking use included cigarettes. She has a 112.50 pack-year smoking history. She has never used smokeless tobacco. She reports that she does not drink alcohol and does not use drugs.  Family History:  Her family history includes Colon cancer in her sister; Liver cancer in her brother; Stomach cancer in her paternal aunt.     Assessment/Plan:   Obstructive sleep apnea. - she reports compliance and benefit from Bipap - she uses [employer] for her DME - current Bipap ordered 02/20/22 - Bipap 12/8 cm H2O   Moderate, persistent asthma. - continue advair and prn albuterol   Time Spent Involved in Patient Care on Day of Examination:  27 minutes  Follow up:   Patient Instructions  Follow up in  4 months  Medication List:   Allergies as of 06/05/2022       Reactions   Adhesive [tape] Itching, Other (See Comments)   Itchy blisters result   Codeine Itching        Medication List        Accurate as of June 05, 2022 12:26 PM. If you have any questions, ask your nurse or doctor.          albuterol 108 (90 Base) MCG/ACT inhaler Commonly known as: VENTOLIN HFA Inhale 2 puffs into the lungs every 6 (six) hours as needed for wheezing.   amLODipine 5 MG tablet Commonly known as: NORVASC Take 5 mg by mouth daily.   atorvastatin 40 MG tablet Commonly known as: LIPITOR Take 40 mg by mouth daily.   calcium-vitamin D 500-5 MG-MCG tablet Commonly known as: OSCAL WITH D Take 1 tablet by mouth daily with breakfast.   CENTRUM SILVER  PO Take 1 tablet by mouth daily after breakfast.   Fluticasone-Salmeterol 100-50 MCG/DOSE Aepb Commonly known as: ADVAIR Inhale 1 puff into the lungs 2 (two) times daily.   L-Lysine 1000 MG Tabs Take 1 tablet by mouth daily at 6 (six) AM.   metoprolol tartrate 25 MG tablet Commonly known as: LOPRESSOR Take 1 tablet (25 mg total) by mouth daily.   Omega-3 1000 MG Caps Take 1 capsule by mouth daily at 6 (six) AM.   oxyCODONE-acetaminophen 5-325 MG tablet Commonly known as: Percocet Take 1 tablet by mouth every 6 (six) hours as needed for moderate pain or severe pain.   pyridoxine 100 MG tablet Commonly known as: B-6 Take 100 mg by mouth daily.   telmisartan-hydrochlorothiazide 80-25 MG tablet Commonly known as: MICARDIS HCT Take 1 tablet by mouth daily.   Turmeric 500 MG Caps Take 1 capsule by mouth daily.   vitamin C 500 MG tablet Commonly known as: ASCORBIC ACID Take 500 mg by mouth daily.   vitamin E 180 MG (400 UNITS) capsule Take 400 Units by mouth daily.   Vitamin-B Complex Tabs Take 1 tablet by mouth daily.   zinc gluconate 50 MG tablet Take 50 mg by mouth daily.        Signature:  Coralyn Helling, MD [facility] Care Pager - [phone] 06/05/2022, 12:26 PM

## 2022-06-05 NOTE — Patient Instructions (Signed)
Follow up in 4 months 

## 2022-06-12 DIAGNOSIS — J449 Chronic obstructive pulmonary disease, unspecified: Secondary | ICD-10-CM | POA: Diagnosis not present

## 2022-06-12 DIAGNOSIS — J029 Acute pharyngitis, unspecified: Secondary | ICD-10-CM | POA: Diagnosis not present

## 2022-06-12 DIAGNOSIS — R0981 Nasal congestion: Secondary | ICD-10-CM | POA: Diagnosis not present

## 2022-06-12 DIAGNOSIS — R509 Fever, unspecified: Secondary | ICD-10-CM | POA: Diagnosis not present

## 2022-06-12 DIAGNOSIS — Z1152 Encounter for screening for COVID-19: Secondary | ICD-10-CM | POA: Diagnosis not present

## 2022-06-12 DIAGNOSIS — R5383 Other fatigue: Secondary | ICD-10-CM | POA: Diagnosis not present

## 2022-06-12 DIAGNOSIS — G4733 Obstructive sleep apnea (adult) (pediatric): Secondary | ICD-10-CM | POA: Diagnosis not present

## 2022-06-26 ENCOUNTER — Telehealth: Payer: Self-pay | Admitting: *Deleted

## 2022-06-26 NOTE — Telephone Encounter (Signed)
ATC patient x1 to schedule for f/u between 07/01/22 and 08/29/22 for her Bipap.  This is an insurance requirement and must be done 31-90 days after set up date which was 05/31/2022.  When she returns call, please schedule follow up between 07/01/22 and 08/29/22.

## 2022-06-30 DIAGNOSIS — R609 Edema, unspecified: Secondary | ICD-10-CM | POA: Diagnosis not present

## 2022-06-30 DIAGNOSIS — R0689 Other abnormalities of breathing: Secondary | ICD-10-CM | POA: Diagnosis not present

## 2022-06-30 DIAGNOSIS — R531 Weakness: Secondary | ICD-10-CM | POA: Diagnosis not present

## 2022-06-30 DIAGNOSIS — I509 Heart failure, unspecified: Secondary | ICD-10-CM | POA: Diagnosis not present

## 2022-06-30 DIAGNOSIS — G4733 Obstructive sleep apnea (adult) (pediatric): Secondary | ICD-10-CM | POA: Diagnosis not present

## 2022-07-31 DIAGNOSIS — I509 Heart failure, unspecified: Secondary | ICD-10-CM | POA: Diagnosis not present

## 2022-07-31 DIAGNOSIS — R609 Edema, unspecified: Secondary | ICD-10-CM | POA: Diagnosis not present

## 2022-07-31 DIAGNOSIS — G4733 Obstructive sleep apnea (adult) (pediatric): Secondary | ICD-10-CM | POA: Diagnosis not present

## 2022-07-31 DIAGNOSIS — R0689 Other abnormalities of breathing: Secondary | ICD-10-CM | POA: Diagnosis not present

## 2022-07-31 DIAGNOSIS — R531 Weakness: Secondary | ICD-10-CM | POA: Diagnosis not present

## 2022-08-31 DIAGNOSIS — R531 Weakness: Secondary | ICD-10-CM | POA: Diagnosis not present

## 2022-08-31 DIAGNOSIS — I509 Heart failure, unspecified: Secondary | ICD-10-CM | POA: Diagnosis not present

## 2022-08-31 DIAGNOSIS — R609 Edema, unspecified: Secondary | ICD-10-CM | POA: Diagnosis not present

## 2022-08-31 DIAGNOSIS — R0689 Other abnormalities of breathing: Secondary | ICD-10-CM | POA: Diagnosis not present

## 2022-08-31 DIAGNOSIS — G4733 Obstructive sleep apnea (adult) (pediatric): Secondary | ICD-10-CM | POA: Diagnosis not present

## 2022-09-22 DIAGNOSIS — N390 Urinary tract infection, site not specified: Secondary | ICD-10-CM | POA: Diagnosis not present

## 2022-09-22 DIAGNOSIS — E1159 Type 2 diabetes mellitus with other circulatory complications: Secondary | ICD-10-CM | POA: Diagnosis not present

## 2022-09-22 DIAGNOSIS — K529 Noninfective gastroenteritis and colitis, unspecified: Secondary | ICD-10-CM | POA: Diagnosis not present

## 2022-09-22 DIAGNOSIS — E1129 Type 2 diabetes mellitus with other diabetic kidney complication: Secondary | ICD-10-CM | POA: Diagnosis not present

## 2022-09-22 DIAGNOSIS — Z23 Encounter for immunization: Secondary | ICD-10-CM | POA: Diagnosis not present

## 2022-09-22 DIAGNOSIS — I1310 Hypertensive heart and chronic kidney disease without heart failure, with stage 1 through stage 4 chronic kidney disease, or unspecified chronic kidney disease: Secondary | ICD-10-CM | POA: Diagnosis not present

## 2022-09-22 DIAGNOSIS — N182 Chronic kidney disease, stage 2 (mild): Secondary | ICD-10-CM | POA: Diagnosis not present

## 2022-09-22 DIAGNOSIS — F339 Major depressive disorder, recurrent, unspecified: Secondary | ICD-10-CM | POA: Diagnosis not present

## 2022-09-30 DIAGNOSIS — R609 Edema, unspecified: Secondary | ICD-10-CM | POA: Diagnosis not present

## 2022-09-30 DIAGNOSIS — R0689 Other abnormalities of breathing: Secondary | ICD-10-CM | POA: Diagnosis not present

## 2022-09-30 DIAGNOSIS — R531 Weakness: Secondary | ICD-10-CM | POA: Diagnosis not present

## 2022-09-30 DIAGNOSIS — I509 Heart failure, unspecified: Secondary | ICD-10-CM | POA: Diagnosis not present

## 2022-09-30 DIAGNOSIS — G4733 Obstructive sleep apnea (adult) (pediatric): Secondary | ICD-10-CM | POA: Diagnosis not present

## 2022-10-10 ENCOUNTER — Ambulatory Visit (INDEPENDENT_AMBULATORY_CARE_PROVIDER_SITE_OTHER): Payer: HMO | Admitting: Emergency Medicine

## 2022-10-10 ENCOUNTER — Encounter: Payer: Self-pay | Admitting: Emergency Medicine

## 2022-10-10 DIAGNOSIS — J439 Emphysema, unspecified: Secondary | ICD-10-CM | POA: Diagnosis not present

## 2022-10-10 DIAGNOSIS — G4733 Obstructive sleep apnea (adult) (pediatric): Secondary | ICD-10-CM

## 2022-10-10 NOTE — Patient Instructions (Signed)
Please continue to use your BiPAP every night while sleeping. You could probably try stopping your Advair and just using your albuterol 2 puffs when you needed for shortness of breath, chest tightness, wheezing.  Keep track of how often you need to use the albuterol she can discuss it with Dr. Halford Chessman. Flu shot up-to-date Follow Dr. Halford Chessman in 6 months or sooner if you have any problems.

## 2022-10-10 NOTE — Assessment & Plan Note (Signed)
COPD/asthma.  Well-controlled at this time with a good functional capacity.  She has intermittent symptoms, uses ProAir most days, no more frequently than once a day.  She has Advair but does not use on a schedule.  I talked her today about stopping it altogether and keeping track of her albuterol use.  She can talk to Dr. Halford Chessman about whether she needs to be back on maintenance therapy.   You could probably try stopping your Advair and just using your albuterol 2 puffs when you needed for shortness of breath, chest tightness, wheezing.  Keep track of how often you need to use the albuterol she can discuss it with Dr. Halford Chessman. Flu shot up-to-date Follow Dr. Halford Chessman in 6 months or sooner if you have any problems.

## 2022-10-10 NOTE — Progress Notes (Signed)
Subjective:    Patient ID: Anne Juarez, female    DOB: Jun 04, 1941, 81 y.o.   MRN: [id number]  HPI Anne Juarez is 81, former smoker (112 pack years) who has been followed in our office by Anne Juarez in our office for COPD, OSA/OHS that is being managed with BiPAP 12/8.  She was last seen in our office 06/05/2022.  She is here for routine follow-up. Today she reports that she is doing well. She occasionally gets exertional SOB, little pattern. She is able to do her housework and shopping. Does her yard work, walks her dog. Minimal cough or wheeze. She is not taking her Advair on a schedule - she uses about once a week. She uses ProAir 0-1x a day depending on her level of exertion. No flares since she last saw Dr Anne Juarez.  Flu shot up to date. She has not had the most recent COVID vaccine - is not planning to at this point.  Wears her BiPAP very reliably - feels that she benefits her > sleep disrupted if she does not wear it. Better rested and better energy during the day when she uses.     Review of Systems As per HPI  Past Medical History:  Diagnosis Date   Anxiety    BMI 39.0-39.9,adult    COPD (chronic obstructive pulmonary disease) (HCC)    Depression    Diabetes mellitus without complication (HCC)    DM (diabetes mellitus) (HCC)    Fibromyalgia    Hyperlipidemia    Hypertension    Obesity    OSA (obstructive sleep apnea) 07/01/2013   Osteoarthritis (arthritis due to wear and tear of joints)    neck and knees,pelvic bone and left shoulder   Palpitations    Spondylolysis    Synovial cyst      Family History  Problem Relation Age of Onset   Colon cancer Sister    Liver cancer Brother    Stomach cancer Paternal Aunt    Esophageal cancer Neg Hx    Rectal cancer Neg Hx      Social History   Socioeconomic History   Marital status: Married    Spouse name: Not on file   Number of children: Not on file   Years of education: Not on file   Highest education level: Not on file   Occupational History   Not on file  Tobacco Use   Smoking status: Former    Packs/day: 2.50    Years: 45.00    Total pack years: 112.50    Types: Cigarettes    Quit date: 03/18/2002    Years since quitting: 20.5   Smokeless tobacco: Never  Substance and Sexual Activity   Alcohol use: No   Drug use: No   Sexual activity: Not on file  Other Topics Concern   Not on file  Social History Narrative   Not on file   Social Determinants of Health   Financial Resource Strain: Not on file  Food Insecurity: Not on file  Transportation Needs: Not on file  Physical Activity: Not on file  Stress: Not on file  Social Connections: Not on file  Intimate Partner Violence: Not on file     Allergies  Allergen Reactions   Adhesive [Tape] Itching and Other (See Comments)    Itchy blisters result   Codeine Itching     Outpatient Medications Prior to Visit  Medication Sig Dispense Refill   albuterol (VENTOLIN HFA) 108 (90 Base) MCG/ACT inhaler Inhale 2  puffs into the lungs every 6 (six) hours as needed for wheezing.     amLODipine (NORVASC) 5 MG tablet Take 5 mg by mouth daily.     atorvastatin (LIPITOR) 40 MG tablet Take 40 mg by mouth daily.     B Complex Vitamins (VITAMIN-B COMPLEX) TABS Take 1 tablet by mouth daily.     calcium-vitamin D (OSCAL WITH D) 500-5 MG-MCG tablet Take 1 tablet by mouth daily with breakfast.     Fluticasone-Salmeterol (ADVAIR) 100-50 MCG/DOSE AEPB Inhale 1 puff into the lungs 2 (two) times daily. 180 each 3   L-Lysine 1000 MG TABS Take 1 tablet by mouth daily at 6 (six) AM.     metoprolol tartrate (LOPRESSOR) 25 MG tablet Take 1 tablet (25 mg total) by mouth daily. 90 tablet 3   Multiple Vitamins-Minerals (CENTRUM SILVER PO) Take 1 tablet by mouth daily after breakfast.      Omega-3 1000 MG CAPS Take 1 capsule by mouth daily at 6 (six) AM.     pyridoxine (B-6) 100 MG tablet Take 100 mg by mouth daily.     telmisartan-hydrochlorothiazide (MICARDIS HCT) 80-25 MG  per tablet Take 1 tablet by mouth daily.     Turmeric 500 MG CAPS Take 1 capsule by mouth daily.     vitamin C (ASCORBIC ACID) 500 MG tablet Take 500 mg by mouth daily.     vitamin E 400 UNIT capsule Take 400 Units by mouth daily.     zinc gluconate 50 MG tablet Take 50 mg by mouth daily.     oxyCODONE-acetaminophen (PERCOCET) 5-325 MG tablet Take 1 tablet by mouth every 6 (six) hours as needed for moderate pain or severe pain. 6 tablet 0   No facility-administered medications prior to visit.        Objective:   Physical Exam Vitals:   10/10/22 1134  BP: (!) 140/72  Pulse: 74  SpO2: 98%  Weight: 215 lb 9.6 oz (97.8 kg)  Height: 5\' 2"  (1.575 m)   Gen: Pleasant, well-nourished, in no distress,  normal affect  ENT: No lesions,  mouth clear,  oropharynx clear, no postnasal drip  Neck: No JVD, no stridor  Lungs: No use of accessory muscles, no crackles or wheezing on normal respiration, no wheeze on forced expiration  Cardiovascular: RRR, heart sounds normal, no murmur or gallops, no peripheral edema  Musculoskeletal: No deformities, no cyanosis or clubbing  Neuro: alert, awake, non focal  Skin: Warm, no lesions or rash      Assessment & Plan:   Pulmonary emphysema COPD/asthma.  Well-controlled at this time with a good functional capacity.  She has intermittent symptoms, uses ProAir most days, no more frequently than once a day.  She has Advair but does not use on a schedule.  I talked her today about stopping it altogether and keeping track of her albuterol use.  She can talk to Dr. about whether she needs to be back on maintenance therapy.   You could probably try stopping your Advair and just using your albuterol 2 puffs when you needed for shortness of breath, chest tightness, wheezing.  Keep track of how often you need to use the albuterol she can discuss it with Anne Juarez. Flu shot up-to-date Follow Anne Juarez in 6 months or sooner if you have any problems.  OSA  (obstructive sleep apnea) Doing well on her BiPAP 12/8.  She had all of her supplies.  Very good compliance and good clinical benefit documented today.  Plan to continue same with follow-up with Dr. Halford Juarez.  Baltazar Apo, MD, PhD 10/10/2022, 12:02 PM [facility] [phone] or if no answer before 7:00PM call [phone] For any issues after 7:00PM please call eLink [phone]

## 2022-10-10 NOTE — Assessment & Plan Note (Signed)
Doing well on her BiPAP 12/8.  She had all of her supplies.  Very good compliance and good clinical benefit documented today.  Plan to continue same with follow-up with Dr. Halford Chessman.

## 2022-10-20 ENCOUNTER — Encounter: Payer: Self-pay | Admitting: Internal Medicine

## 2022-10-30 ENCOUNTER — Encounter: Payer: Self-pay | Admitting: Cardiovascular Disease

## 2022-10-30 NOTE — Progress Notes (Unsigned)
Cardiology Office Note:    Date:  10/31/2022   ID:  Anne Juarez, DOB [birth year], MRN [id number]  PCP:  Melida Quitter, MD   [facility] Providers Cardiologist:  Daissy Yerian    Referring MD: Melida Quitter, MD   Chief Complaint  Patient presents with   Palpitations     History of Present Illness:    Anne Juarez is a 81 y.o. female with a hx of  COPD, HLD, HTN, obesity   We were asked to see her by Dr. Nadene Rubins for further eval and management of her CP and palpitations .   She has been feeling well.  She was found to have some irreg on ECG and we were asked to see her .  No CP , has chronic dyspnea ( COPD )  Lots of phlegm. Walks her dog several times a day ,   does housework,  shops on her own , does her own yard work , [employer],  trims hedges, light tree work  Has some worsening of her dyspnea with activity  Is very limited by arthritis . ( Spine issues )   No chest fullness, tightness, no CP   Has had palitations in the past .   Has severe OSA.   CPAP machine is broken - has not bought another one. Needs to see another sleep doctor   Saw Dr. Craige Cotta previously    Nov. 14, 2023 Danielle is seen today for follow up of her palpitations Has severe OSA , Has seen Dr. Mariel Sleet from March, 2023 - hyperdynamic LV systlic function,moderate LVH Trivial MR  14 day monitor - very brief episodes of SVT - likely exacerbated by her OSA  We started Toprol XL 25 mg a day   Has a new CPAP machine   No specific complaints        Past Medical History:  Diagnosis Date   Anxiety    BMI 39.0-39.9,adult    COPD (chronic obstructive pulmonary disease) (HCC)    Depression    Diabetes mellitus without complication (HCC)    DM (diabetes mellitus) (HCC)    Fibromyalgia    Hyperlipidemia    Hypertension    Obesity    OSA (obstructive sleep apnea) 07/01/2013   Osteoarthritis (arthritis due to wear and tear of joints)    neck and knees,pelvic bone and left shoulder    Palpitations    Spondylolysis    Synovial cyst     Past Surgical History:  Procedure Laterality Date   COLONOSCOPY     LAPAROSCOPIC TUBAL LIGATION     PILONIDAL CYST EXCISION     POLYPECTOMY     TONSILLECTOMY      Current Medications: Current Meds  Medication Sig   albuterol (VENTOLIN HFA) 108 (90 Base) MCG/ACT inhaler Inhale 2 puffs into the lungs every 6 (six) hours as needed for wheezing.   amLODipine (NORVASC) 5 MG tablet Take 5 mg by mouth daily.   atorvastatin (LIPITOR) 40 MG tablet Take 40 mg by mouth daily.   B Complex Vitamins (VITAMIN-B COMPLEX) TABS Take 1 tablet by mouth daily.   calcium-vitamin D (OSCAL WITH D) 500-5 MG-MCG tablet Take 1 tablet by mouth daily with breakfast.   Fluticasone-Salmeterol (ADVAIR) 100-50 MCG/DOSE AEPB Inhale 1 puff into the lungs 2 (two) times daily.   gabapentin (NEURONTIN) 100 MG capsule Take 100 mg by mouth 2 (two) times daily as needed.   L-Lysine 1000 MG TABS Take 1 tablet by mouth daily  at 6 (six) AM.   metoprolol tartrate (LOPRESSOR) 25 MG tablet Take 1 tablet (25 mg total) by mouth daily.   Multiple Vitamins-Minerals (CENTRUM SILVER PO) Take 1 tablet by mouth daily after breakfast.    Omega-3 1000 MG CAPS Take 1 capsule by mouth daily at 6 (six) AM.   pyridoxine (B-6) 100 MG tablet Take 100 mg by mouth daily.   telmisartan-hydrochlorothiazide (MICARDIS HCT) 80-25 MG per tablet Take 1 tablet by mouth daily.   Turmeric 500 MG CAPS Take 1 capsule by mouth daily.   vitamin C (ASCORBIC ACID) 500 MG tablet Take 500 mg by mouth daily.   vitamin E 400 UNIT capsule Take 400 Units by mouth daily.   zinc gluconate 50 MG tablet Take 50 mg by mouth daily.     Allergies:   Adhesive [tape] and Codeine   Social History   Socioeconomic History   Marital status: Married    Spouse name: Not on file   Number of children: Not on file   Years of education: Not on file   Highest education level: Not on file  Occupational History   Not on file   Tobacco Use   Smoking status: Former    Packs/day: 2.50    Years: 45.00    Total pack years: 112.50    Types: Cigarettes    Quit date: 03/18/2002    Years since quitting: 20.6   Smokeless tobacco: Never  Substance and Sexual Activity   Alcohol use: No   Drug use: No   Sexual activity: Not on file  Other Topics Concern   Not on file  Social History Narrative   Not on file   Social Determinants of Health   Financial Resource Strain: Not on file  Food Insecurity: Not on file  Transportation Needs: Not on file  Physical Activity: Not on file  Stress: Not on file  Social Connections: Not on file     Family History: The patient's family history includes Colon cancer in her sister; Liver cancer in her brother; Stomach cancer in her paternal aunt. There is no history of Esophageal cancer or Rectal cancer.  ROS:   Please see the history of present illness.     All other systems reviewed and are negative.  EKGs/Labs/Other Studies Reviewed:    The following studies were reviewed today:  EKG:    Recent Labs: No results found for requested labs within last 365 days.  Recent Lipid Panel No results found for: "CHOL", "TRIG", "HDL", "CHOLHDL", "VLDL", "LDLCALC", "LDLDIRECT"   Risk Assessment/Calculations:           Physical Exam:    Physical Exam: Blood pressure 134/60, pulse 78, height 5\' 2"  (1.575 m), weight 217 lb (98.4 kg), SpO2 96 %.       GEN:  elderly female,   generally weak  in no acute distress HEENT: Normal NECK: No JVD; No carotid bruits LYMPHATICS: No lymphadenopathy CARDIAC: RRR , no murmurs, rubs, gallops RESPIRATORY:  Clear to auscultation without rales, wheezing or rhonchi  ABDOMEN: Soft, non-tender, non-distended MUSCULOSKELETAL:  No edema; No deformity  SKIN: Warm and dry NEUROLOGIC:  Alert and oriented x 3   ASSESSMENT:    1. Chronic diastolic heart failure (HCC)   2. SVT (supraventricular tachycardia)     PLAN:     Palpitations: She  is not having too much in the way of additional palpitations.  Continue current medications.  The Toprol-XL seems to be helping.  2.   Obstructive  sleep apnea: She recently got his new sleep  3.  Chronic diastolic CHF:  has normal LV systolic function ,   we were unable to evaluate her diastolic function but I strongly suspect that she has DD . Advised weight loss ,  BP is well controlled.           Medication Adjustments/Labs and Tests Ordered: Current medicines are reviewed at length with the patient today.  Concerns regarding medicines are outlined above.  No orders of the defined types were placed in this encounter.  No orders of the defined types were placed in this encounter.   Patient Instructions  Medication Instructions:  Your physician recommends that you continue on your current medications as directed. Please refer to the Current Medication list given to you today.  *If you need a refill on your cardiac medications before your next appointment, please call your pharmacy*   Lab Work: NONE If you have labs (blood work) drawn today and your tests are completely normal, you will receive your results only by: MyChart Message (if you have MyChart) OR A paper copy in the mail If you have any lab test that is abnormal or we need to change your treatment, we will call you to review the results.   Testing/Procedures: NONE   Follow-Up: At [facility], you and your health needs are our priority.  As part of our continuing mission to provide you with exceptional heart care, we have created designated Provider Care Teams.  These Care Teams include your primary Cardiologist (physician) and Advanced Practice Providers (APPs -  Physician Assistants and Nurse Practitioners) who all work together to provide you with the care you need, when you need it.  We recommend signing up for the patient portal called "MyChart".  Sign up information is provided on this After Visit  Summary.  MyChart is used to connect with patients for Virtual Visits (Telemedicine).  Patients are able to view lab/test results, encounter notes, upcoming appointments, etc.  Non-urgent messages can be sent to your provider as well.   To learn more about what you can do with MyChart, go to [email or web address].    Your next appointment:   1 year(s)  The format for your next appointment:   In Person  Provider:   Kristeen Miss, MD       Important Information About Sugar         Signed, Kristeen Miss, MD  10/31/2022 11:50 AM    [facility]

## 2022-10-31 ENCOUNTER — Encounter: Payer: Self-pay | Admitting: Cardiovascular Disease

## 2022-10-31 ENCOUNTER — Ambulatory Visit: Payer: HMO | Attending: Cardiovascular Disease | Admitting: Cardiovascular Disease

## 2022-10-31 VITALS — BP 134/60 | HR 78 | Ht 62.0 in | Wt 217.0 lb

## 2022-10-31 DIAGNOSIS — R531 Weakness: Secondary | ICD-10-CM | POA: Diagnosis not present

## 2022-10-31 DIAGNOSIS — G4733 Obstructive sleep apnea (adult) (pediatric): Secondary | ICD-10-CM | POA: Diagnosis not present

## 2022-10-31 DIAGNOSIS — R609 Edema, unspecified: Secondary | ICD-10-CM | POA: Diagnosis not present

## 2022-10-31 DIAGNOSIS — I509 Heart failure, unspecified: Secondary | ICD-10-CM | POA: Diagnosis not present

## 2022-10-31 DIAGNOSIS — I471 Supraventricular tachycardia, unspecified: Secondary | ICD-10-CM | POA: Diagnosis not present

## 2022-10-31 DIAGNOSIS — I5032 Chronic diastolic (congestive) heart failure: Secondary | ICD-10-CM | POA: Diagnosis not present

## 2022-10-31 DIAGNOSIS — R0689 Other abnormalities of breathing: Secondary | ICD-10-CM | POA: Diagnosis not present

## 2022-10-31 NOTE — Patient Instructions (Signed)
Medication Instructions:  Your physician recommends that you continue on your current medications as directed. Please refer to the Current Medication list given to you today.  *If you need a refill on your cardiac medications before your next appointment, please call your pharmacy*   Lab Work: NONE If you have labs (blood work) drawn today and your tests are completely normal, you will receive your results only by: MyChart Message (if you have MyChart) OR A paper copy in the mail If you have any lab test that is abnormal or we need to change your treatment, we will call you to review the results.   Testing/Procedures: NONE   Follow-Up: At  HeartCare, you and your health needs are our priority.  As part of our continuing mission to provide you with exceptional heart care, we have created designated Provider Care Teams.  These Care Teams include your primary Cardiologist (physician) and Advanced Practice Providers (APPs -  Physician Assistants and Nurse Practitioners) who all work together to provide you with the care you need, when you need it.  We recommend signing up for the patient portal called "MyChart".  Sign up information is provided on this After Visit Summary.  MyChart is used to connect with patients for Virtual Visits (Telemedicine).  Patients are able to view lab/test results, encounter notes, upcoming appointments, etc.  Non-urgent messages can be sent to your provider as well.   To learn more about what you can do with MyChart, go to [email or web address].    Your next appointment:   1 year(s)  The format for your next appointment:   In Person  Provider:   Philip Nahser, MD       Important Information About Sugar       

## 2022-11-30 DIAGNOSIS — R531 Weakness: Secondary | ICD-10-CM | POA: Diagnosis not present

## 2022-11-30 DIAGNOSIS — R609 Edema, unspecified: Secondary | ICD-10-CM | POA: Diagnosis not present

## 2022-11-30 DIAGNOSIS — G4733 Obstructive sleep apnea (adult) (pediatric): Secondary | ICD-10-CM | POA: Diagnosis not present

## 2022-11-30 DIAGNOSIS — R0689 Other abnormalities of breathing: Secondary | ICD-10-CM | POA: Diagnosis not present

## 2022-11-30 DIAGNOSIS — I509 Heart failure, unspecified: Secondary | ICD-10-CM | POA: Diagnosis not present

## 2022-12-29 DIAGNOSIS — E785 Hyperlipidemia, unspecified: Secondary | ICD-10-CM | POA: Diagnosis not present

## 2022-12-29 DIAGNOSIS — I10 Essential (primary) hypertension: Secondary | ICD-10-CM | POA: Diagnosis not present

## 2022-12-29 DIAGNOSIS — R7989 Other specified abnormal findings of blood chemistry: Secondary | ICD-10-CM | POA: Diagnosis not present

## 2022-12-29 DIAGNOSIS — E1159 Type 2 diabetes mellitus with other circulatory complications: Secondary | ICD-10-CM | POA: Diagnosis not present

## 2022-12-31 DIAGNOSIS — R0689 Other abnormalities of breathing: Secondary | ICD-10-CM | POA: Diagnosis not present

## 2022-12-31 DIAGNOSIS — R609 Edema, unspecified: Secondary | ICD-10-CM | POA: Diagnosis not present

## 2022-12-31 DIAGNOSIS — G4733 Obstructive sleep apnea (adult) (pediatric): Secondary | ICD-10-CM | POA: Diagnosis not present

## 2022-12-31 DIAGNOSIS — I509 Heart failure, unspecified: Secondary | ICD-10-CM | POA: Diagnosis not present

## 2022-12-31 DIAGNOSIS — R531 Weakness: Secondary | ICD-10-CM | POA: Diagnosis not present

## 2023-01-05 DIAGNOSIS — Z741 Need for assistance with personal care: Secondary | ICD-10-CM | POA: Diagnosis not present

## 2023-01-05 DIAGNOSIS — K529 Noninfective gastroenteritis and colitis, unspecified: Secondary | ICD-10-CM | POA: Diagnosis not present

## 2023-01-05 DIAGNOSIS — E785 Hyperlipidemia, unspecified: Secondary | ICD-10-CM | POA: Diagnosis not present

## 2023-01-05 DIAGNOSIS — I5032 Chronic diastolic (congestive) heart failure: Secondary | ICD-10-CM | POA: Diagnosis not present

## 2023-01-05 DIAGNOSIS — Z1339 Encounter for screening examination for other mental health and behavioral disorders: Secondary | ICD-10-CM | POA: Diagnosis not present

## 2023-01-05 DIAGNOSIS — N182 Chronic kidney disease, stage 2 (mild): Secondary | ICD-10-CM | POA: Diagnosis not present

## 2023-01-05 DIAGNOSIS — I130 Hypertensive heart and chronic kidney disease with heart failure and stage 1 through stage 4 chronic kidney disease, or unspecified chronic kidney disease: Secondary | ICD-10-CM | POA: Diagnosis not present

## 2023-01-05 DIAGNOSIS — Z1331 Encounter for screening for depression: Secondary | ICD-10-CM | POA: Diagnosis not present

## 2023-01-05 DIAGNOSIS — Z0000 Encounter for general adult medical examination without abnormal findings: Secondary | ICD-10-CM | POA: Diagnosis not present

## 2023-01-05 DIAGNOSIS — E1159 Type 2 diabetes mellitus with other circulatory complications: Secondary | ICD-10-CM | POA: Diagnosis not present

## 2023-01-05 DIAGNOSIS — Z6841 Body Mass Index (BMI) 40.0 and over, adult: Secondary | ICD-10-CM | POA: Diagnosis not present

## 2023-01-05 DIAGNOSIS — J449 Chronic obstructive pulmonary disease, unspecified: Secondary | ICD-10-CM | POA: Diagnosis not present

## 2023-01-05 DIAGNOSIS — R82998 Other abnormal findings in urine: Secondary | ICD-10-CM | POA: Diagnosis not present

## 2023-01-05 DIAGNOSIS — I471 Supraventricular tachycardia, unspecified: Secondary | ICD-10-CM | POA: Diagnosis not present

## 2023-01-05 DIAGNOSIS — Z13828 Encounter for screening for other musculoskeletal disorder: Secondary | ICD-10-CM | POA: Diagnosis not present

## 2023-01-05 DIAGNOSIS — F332 Major depressive disorder, recurrent severe without psychotic features: Secondary | ICD-10-CM | POA: Diagnosis not present

## 2023-01-05 DIAGNOSIS — M797 Fibromyalgia: Secondary | ICD-10-CM | POA: Diagnosis not present

## 2023-01-30 DIAGNOSIS — E119 Type 2 diabetes mellitus without complications: Secondary | ICD-10-CM | POA: Diagnosis not present

## 2023-01-31 DIAGNOSIS — R0689 Other abnormalities of breathing: Secondary | ICD-10-CM | POA: Diagnosis not present

## 2023-01-31 DIAGNOSIS — R531 Weakness: Secondary | ICD-10-CM | POA: Diagnosis not present

## 2023-01-31 DIAGNOSIS — R609 Edema, unspecified: Secondary | ICD-10-CM | POA: Diagnosis not present

## 2023-01-31 DIAGNOSIS — I509 Heart failure, unspecified: Secondary | ICD-10-CM | POA: Diagnosis not present

## 2023-01-31 DIAGNOSIS — G4733 Obstructive sleep apnea (adult) (pediatric): Secondary | ICD-10-CM | POA: Diagnosis not present

## 2023-02-01 DIAGNOSIS — K529 Noninfective gastroenteritis and colitis, unspecified: Secondary | ICD-10-CM | POA: Diagnosis not present

## 2023-02-01 DIAGNOSIS — N182 Chronic kidney disease, stage 2 (mild): Secondary | ICD-10-CM | POA: Diagnosis not present

## 2023-02-01 DIAGNOSIS — I5032 Chronic diastolic (congestive) heart failure: Secondary | ICD-10-CM | POA: Diagnosis not present

## 2023-02-01 DIAGNOSIS — E1129 Type 2 diabetes mellitus with other diabetic kidney complication: Secondary | ICD-10-CM | POA: Diagnosis not present

## 2023-02-01 DIAGNOSIS — I130 Hypertensive heart and chronic kidney disease with heart failure and stage 1 through stage 4 chronic kidney disease, or unspecified chronic kidney disease: Secondary | ICD-10-CM | POA: Diagnosis not present

## 2023-02-22 ENCOUNTER — Ambulatory Visit: Payer: HMO | Admitting: Physician Assistant

## 2023-03-01 DIAGNOSIS — R531 Weakness: Secondary | ICD-10-CM | POA: Diagnosis not present

## 2023-03-01 DIAGNOSIS — R609 Edema, unspecified: Secondary | ICD-10-CM | POA: Diagnosis not present

## 2023-03-01 DIAGNOSIS — G4733 Obstructive sleep apnea (adult) (pediatric): Secondary | ICD-10-CM | POA: Diagnosis not present

## 2023-03-01 DIAGNOSIS — R0689 Other abnormalities of breathing: Secondary | ICD-10-CM | POA: Diagnosis not present

## 2023-03-01 DIAGNOSIS — I509 Heart failure, unspecified: Secondary | ICD-10-CM | POA: Diagnosis not present

## 2023-03-16 ENCOUNTER — Other Ambulatory Visit: Payer: Self-pay | Admitting: Cardiovascular Disease

## 2023-03-22 ENCOUNTER — Encounter: Payer: Self-pay | Admitting: Internal Medicine

## 2023-04-01 DIAGNOSIS — R609 Edema, unspecified: Secondary | ICD-10-CM | POA: Diagnosis not present

## 2023-04-01 DIAGNOSIS — I509 Heart failure, unspecified: Secondary | ICD-10-CM | POA: Diagnosis not present

## 2023-04-01 DIAGNOSIS — G4733 Obstructive sleep apnea (adult) (pediatric): Secondary | ICD-10-CM | POA: Diagnosis not present

## 2023-04-01 DIAGNOSIS — R531 Weakness: Secondary | ICD-10-CM | POA: Diagnosis not present

## 2023-04-01 DIAGNOSIS — R0689 Other abnormalities of breathing: Secondary | ICD-10-CM | POA: Diagnosis not present

## 2023-04-26 DIAGNOSIS — I5032 Chronic diastolic (congestive) heart failure: Secondary | ICD-10-CM | POA: Diagnosis not present

## 2023-04-26 DIAGNOSIS — N182 Chronic kidney disease, stage 2 (mild): Secondary | ICD-10-CM | POA: Diagnosis not present

## 2023-04-26 DIAGNOSIS — J449 Chronic obstructive pulmonary disease, unspecified: Secondary | ICD-10-CM | POA: Diagnosis not present

## 2023-04-26 DIAGNOSIS — I471 Supraventricular tachycardia, unspecified: Secondary | ICD-10-CM | POA: Diagnosis not present

## 2023-04-26 DIAGNOSIS — E1159 Type 2 diabetes mellitus with other circulatory complications: Secondary | ICD-10-CM | POA: Diagnosis not present

## 2023-04-26 DIAGNOSIS — E1129 Type 2 diabetes mellitus with other diabetic kidney complication: Secondary | ICD-10-CM | POA: Diagnosis not present

## 2023-04-26 DIAGNOSIS — M797 Fibromyalgia: Secondary | ICD-10-CM | POA: Diagnosis not present

## 2023-04-26 DIAGNOSIS — I130 Hypertensive heart and chronic kidney disease with heart failure and stage 1 through stage 4 chronic kidney disease, or unspecified chronic kidney disease: Secondary | ICD-10-CM | POA: Diagnosis not present

## 2023-04-30 ENCOUNTER — Ambulatory Visit (HOSPITAL_BASED_OUTPATIENT_CLINIC_OR_DEPARTMENT_OTHER): Payer: HMO | Admitting: Pulmonary Disease

## 2023-05-16 ENCOUNTER — Ambulatory Visit (HOSPITAL_BASED_OUTPATIENT_CLINIC_OR_DEPARTMENT_OTHER): Payer: HMO | Admitting: Pulmonary Disease

## 2023-05-31 ENCOUNTER — Ambulatory Visit: Payer: HMO | Admitting: Internal Medicine

## 2023-07-11 ENCOUNTER — Ambulatory Visit (HOSPITAL_BASED_OUTPATIENT_CLINIC_OR_DEPARTMENT_OTHER): Payer: HMO | Admitting: Pulmonary Disease

## 2023-09-03 ENCOUNTER — Encounter: Payer: Self-pay | Admitting: Cardiovascular Disease

## 2023-10-05 ENCOUNTER — Other Ambulatory Visit: Payer: Self-pay | Admitting: Cardiovascular Disease

## 2023-10-19 DIAGNOSIS — I471 Supraventricular tachycardia, unspecified: Secondary | ICD-10-CM | POA: Diagnosis not present

## 2023-10-19 DIAGNOSIS — R82998 Other abnormal findings in urine: Secondary | ICD-10-CM | POA: Diagnosis not present

## 2023-10-19 DIAGNOSIS — E1159 Type 2 diabetes mellitus with other circulatory complications: Secondary | ICD-10-CM | POA: Diagnosis not present

## 2023-10-19 DIAGNOSIS — J449 Chronic obstructive pulmonary disease, unspecified: Secondary | ICD-10-CM | POA: Diagnosis not present

## 2023-10-19 DIAGNOSIS — Z6841 Body Mass Index (BMI) 40.0 and over, adult: Secondary | ICD-10-CM | POA: Diagnosis not present

## 2023-10-19 DIAGNOSIS — I700 Atherosclerosis of aorta: Secondary | ICD-10-CM | POA: Diagnosis not present

## 2023-10-19 DIAGNOSIS — N182 Chronic kidney disease, stage 2 (mild): Secondary | ICD-10-CM | POA: Diagnosis not present

## 2023-10-19 DIAGNOSIS — Z23 Encounter for immunization: Secondary | ICD-10-CM | POA: Diagnosis not present

## 2023-10-19 DIAGNOSIS — E1129 Type 2 diabetes mellitus with other diabetic kidney complication: Secondary | ICD-10-CM | POA: Diagnosis not present

## 2023-10-19 DIAGNOSIS — I130 Hypertensive heart and chronic kidney disease with heart failure and stage 1 through stage 4 chronic kidney disease, or unspecified chronic kidney disease: Secondary | ICD-10-CM | POA: Diagnosis not present

## 2023-10-19 DIAGNOSIS — M797 Fibromyalgia: Secondary | ICD-10-CM | POA: Diagnosis not present

## 2023-10-19 DIAGNOSIS — M79604 Pain in right leg: Secondary | ICD-10-CM | POA: Diagnosis not present

## 2023-10-19 DIAGNOSIS — I5032 Chronic diastolic (congestive) heart failure: Secondary | ICD-10-CM | POA: Diagnosis not present

## 2023-10-29 ENCOUNTER — Encounter: Payer: Self-pay | Admitting: Cardiovascular Disease

## 2023-10-29 NOTE — Progress Notes (Unsigned)
Cardiology Office Note:    Date:  10/30/2023   ID:  Anne Juarez, DOB May 19, 1941, MRN [id number]  PCP:  Melida Quitter, MD   [facility] Providers Cardiologist:  Micaiah Remillard    Referring MD: Melida Quitter, MD   Chief Complaint  Patient presents with   Congestive Heart Failure   Palpitations     History of Present Illness:    Anne Juarez is a 82 y.o. female with a hx of  COPD, HLD, HTN, obesity   We were asked to see her by Dr. Nadene Rubins for further eval and management of her CP and palpitations .   She has been feeling well.  She was found to have some irreg on ECG and we were asked to see her .  No CP , has chronic dyspnea ( COPD )  Lots of phlegm. Walks her dog several times a day ,   does housework,  shops on her own , does her own yard work , [employer],  trims hedges, light tree work  Has some worsening of her dyspnea with activity  Is very limited by arthritis . ( Spine issues )   No chest fullness, tightness, no CP   Has had palitations in the past .   Has severe OSA.   CPAP machine is broken - has not bought another one. Needs to see another sleep doctor   Saw Dr. Craige Cotta previously    Nov. 14, 2023 Anne Juarez is seen today for follow up of her palpitations Has severe OSA , Has seen Dr. Mariel Sleet from March, 2023 - hyperdynamic LV systlic function,moderate LVH Trivial MR  14 day monitor - very brief episodes of SVT - likely exacerbated by her OSA  We started Toprol XL 25 mg a day   Has a new CPAP machine   No specific complaints    Nov. 12, 2024 Anne Juarez is seen for follow up of her palpitations, severe OSA, hyperdynamic LV function  Wt is 216  Is wearing her CPAP   She did not sleep well last night - this may explain her elevated BP today      Past Medical History:  Diagnosis Date   Anxiety    BMI 39.0-39.9,adult    COPD (chronic obstructive pulmonary disease) (HCC)    Depression    Diabetes mellitus without complication (HCC)    DM (diabetes  mellitus) (HCC)    Fibromyalgia    Hyperlipidemia    Hypertension    Obesity    OSA (obstructive sleep apnea) 07/01/2013   Osteoarthritis (arthritis due to wear and tear of joints)    neck and knees,pelvic bone and left shoulder   Palpitations    Spondylolysis    Synovial cyst     Past Surgical History:  Procedure Laterality Date   COLONOSCOPY     LAPAROSCOPIC TUBAL LIGATION     PILONIDAL CYST EXCISION     POLYPECTOMY     TONSILLECTOMY      Current Medications: Current Meds  Medication Sig   albuterol (VENTOLIN HFA) 108 (90 Base) MCG/ACT inhaler Inhale 2 puffs into the lungs every 6 (six) hours as needed for wheezing.   amLODipine (NORVASC) 5 MG tablet Take 5 mg by mouth daily.   atorvastatin (LIPITOR) 40 MG tablet Take 40 mg by mouth daily.   B Complex Vitamins (VITAMIN-B COMPLEX) TABS Take 1 tablet by mouth daily.   calcium-vitamin D (OSCAL WITH D) 500-5 MG-MCG tablet Take 1 tablet by  mouth daily with breakfast.   FARXIGA 5 MG TABS tablet Take 5 mg by mouth daily.   Fluticasone-Salmeterol (ADVAIR) 100-50 MCG/DOSE AEPB Inhale 1 puff into the lungs 2 (two) times daily.   gabapentin (NEURONTIN) 100 MG capsule Take 100 mg by mouth 2 (two) times daily as needed.   guaifenesin (HUMIBID E) 400 MG TABS tablet Take 400 mg by mouth.   L-Lysine 1000 MG TABS Take 1 tablet by mouth daily at 6 (six) AM.   loperamide (IMODIUM) 2 MG capsule Take 2 mg by mouth as needed.   Magnesium 400 MG TABS as directed Orally   metoprolol tartrate (LOPRESSOR) 25 MG tablet TAKE 1 TABLET (25 MG TOTAL) BY MOUTH DAILY.   Multiple Vitamins-Minerals (CENTRUM SILVER PO) Take 1 tablet by mouth daily after breakfast.    Omega-3 1000 MG CAPS Take 1 capsule by mouth daily at 6 (six) AM.   pyridoxine (B-6) 100 MG tablet Take 100 mg by mouth daily.   telmisartan-hydrochlorothiazide (MICARDIS HCT) 80-25 MG per tablet Take 1 tablet by mouth daily.   vitamin E 400 UNIT capsule Take 400 Units by mouth daily.      Allergies:   Adhesive [tape] and Codeine   Social History   Socioeconomic History   Marital status: Married    Spouse name: Not on file   Number of children: Not on file   Years of education: Not on file   Highest education level: Not on file  Occupational History   Not on file  Tobacco Use   Smoking status: Former    Current packs/day: 0.00    Average packs/day: 2.5 packs/day for 45.0 years (112.5 ttl pk-yrs)    Types: Cigarettes    Start date: 03/18/1957    Quit date: 03/18/2002    Years since quitting: 21.6   Smokeless tobacco: Never  Substance and Sexual Activity   Alcohol use: No   Drug use: No   Sexual activity: Not on file  Other Topics Concern   Not on file  Social History Narrative   ** Merged History Encounter **       Social Determinants of Health   Financial Resource Strain: Not on file  Food Insecurity: Not on file  Transportation Needs: Not on file  Physical Activity: Not on file  Stress: Not on file  Social Connections: Unknown (04/30/2022)   Received from [facility]   Social Network    Social Network: Not on file     Family History: The patient's family history includes Colon cancer in her sister; Liver cancer in her brother; Stomach cancer in her paternal aunt. There is no history of Esophageal cancer or Rectal cancer.  ROS:   Please see the history of present illness.     All other systems reviewed and are negative.  EKGs/Labs/Other Studies Reviewed:    The following studies were reviewed today:  EKG:     EKG Interpretation Date/Time:  Tuesday October 30 2023 10:48:54 EST Ventricular Rate:  78 PR Interval:  148 QRS Duration:  82 QT Interval:  402 QTC Calculation: 458 R Axis:   20  Text Interpretation: Normal sinus rhythm Nonspecific ST and T wave abnormality When compared with ECG of 27-Aug-2007 10:40, Minimal criteria for Inferior infarct are no longer Present Confirmed by Kristeen Miss ([id number]) on 10/30/2023  11:01:28 AM    Recent Labs: No results found for requested labs within last 365 days.  Recent Lipid Panel No results found for: "CHOL", "TRIG", "HDL", "  CHOLHDL", "VLDL", "LDLCALC", "LDLDIRECT"   Risk Assessment/Calculations:           Physical Exam:     Physical Exam: Blood pressure 132/70, pulse 77, height 5\' 2"  (1.575 m), weight 216 lb 6.4 oz (98.2 kg), SpO2 93%.       GEN:  elderly famele,  moderately obese  in no acute distress HEENT: Normal NECK: No JVD; No carotid bruits LYMPHATICS: No lymphadenopathy CARDIAC: RRR , no murmurs, rubs, gallops RESPIRATORY:  Clear to auscultation without rales, wheezing or rhonchi  ABDOMEN: Soft, non-tender, non-distended MUSCULOSKELETAL:  No edema; No deformity  SKIN: Warm and dry NEUROLOGIC:  Alert and oriented x 3    ASSESSMENT:    1. Chronic diastolic heart failure (HCC)      PLAN:     Palpitations:   well controlled.  2.   Obstructive sleep apnea:    uses her CPAP .   3.  Chronic diastolic CHF:    advised salt restriction, calorie restriction  Advised more exercise           Medication Adjustments/Labs and Tests Ordered: Current medicines are reviewed at length with the patient today.  Concerns regarding medicines are outlined above.  Orders Placed This Encounter  Procedures   EKG 12-Lead   No orders of the defined types were placed in this encounter.   There are no Patient Instructions on file for this visit.   Signed, Kristeen Miss, MD  10/30/2023 11:05 AM    [facility]

## 2023-10-30 ENCOUNTER — Ambulatory Visit: Payer: PPO | Attending: Cardiovascular Disease | Admitting: Cardiovascular Disease

## 2023-10-30 ENCOUNTER — Encounter: Payer: Self-pay | Admitting: Cardiovascular Disease

## 2023-10-30 VITALS — BP 132/70 | HR 77 | Ht 62.0 in | Wt 216.4 lb

## 2023-10-30 DIAGNOSIS — G4733 Obstructive sleep apnea (adult) (pediatric): Secondary | ICD-10-CM

## 2023-10-30 DIAGNOSIS — I5032 Chronic diastolic (congestive) heart failure: Secondary | ICD-10-CM | POA: Diagnosis not present

## 2023-10-30 NOTE — Patient Instructions (Signed)
 Follow-Up: At [facility], you and your health needs are our priority.  As part of our continuing mission to provide you with exceptional heart care, we have created designated Provider Care Teams.  These Care Teams include your primary Cardiologist (physician) and Advanced Practice Providers (APPs -  Physician Assistants and Nurse Practitioners) who all work together to provide you with the care you need, when you need it.  Your next appointment:   1 year(s)  Provider:   Kristeen Miss, MD

## 2024-01-03 DIAGNOSIS — I130 Hypertensive heart and chronic kidney disease with heart failure and stage 1 through stage 4 chronic kidney disease, or unspecified chronic kidney disease: Secondary | ICD-10-CM | POA: Diagnosis not present

## 2024-01-03 DIAGNOSIS — I5032 Chronic diastolic (congestive) heart failure: Secondary | ICD-10-CM | POA: Diagnosis not present

## 2024-01-03 DIAGNOSIS — N182 Chronic kidney disease, stage 2 (mild): Secondary | ICD-10-CM | POA: Diagnosis not present

## 2024-01-03 DIAGNOSIS — E785 Hyperlipidemia, unspecified: Secondary | ICD-10-CM | POA: Diagnosis not present

## 2024-01-03 DIAGNOSIS — E1129 Type 2 diabetes mellitus with other diabetic kidney complication: Secondary | ICD-10-CM | POA: Diagnosis not present

## 2024-02-24 ENCOUNTER — Other Ambulatory Visit: Payer: Self-pay | Admitting: Cardiovascular Disease

## 2024-03-11 DIAGNOSIS — E785 Hyperlipidemia, unspecified: Secondary | ICD-10-CM | POA: Diagnosis not present

## 2024-03-11 DIAGNOSIS — F332 Major depressive disorder, recurrent severe without psychotic features: Secondary | ICD-10-CM | POA: Diagnosis not present

## 2024-03-11 DIAGNOSIS — I130 Hypertensive heart and chronic kidney disease with heart failure and stage 1 through stage 4 chronic kidney disease, or unspecified chronic kidney disease: Secondary | ICD-10-CM | POA: Diagnosis not present

## 2024-03-11 DIAGNOSIS — E1159 Type 2 diabetes mellitus with other circulatory complications: Secondary | ICD-10-CM | POA: Diagnosis not present

## 2024-03-11 DIAGNOSIS — R82998 Other abnormal findings in urine: Secondary | ICD-10-CM | POA: Diagnosis not present

## 2024-03-11 DIAGNOSIS — N182 Chronic kidney disease, stage 2 (mild): Secondary | ICD-10-CM | POA: Diagnosis not present

## 2024-03-11 DIAGNOSIS — Z0000 Encounter for general adult medical examination without abnormal findings: Secondary | ICD-10-CM | POA: Diagnosis not present

## 2024-03-11 DIAGNOSIS — Z6841 Body Mass Index (BMI) 40.0 and over, adult: Secondary | ICD-10-CM | POA: Diagnosis not present

## 2024-03-11 DIAGNOSIS — E1129 Type 2 diabetes mellitus with other diabetic kidney complication: Secondary | ICD-10-CM | POA: Diagnosis not present

## 2024-03-11 DIAGNOSIS — I5032 Chronic diastolic (congestive) heart failure: Secondary | ICD-10-CM | POA: Diagnosis not present

## 2024-03-11 DIAGNOSIS — Z1331 Encounter for screening for depression: Secondary | ICD-10-CM | POA: Diagnosis not present

## 2024-03-11 DIAGNOSIS — Z1339 Encounter for screening examination for other mental health and behavioral disorders: Secondary | ICD-10-CM | POA: Diagnosis not present

## 2024-03-11 DIAGNOSIS — M797 Fibromyalgia: Secondary | ICD-10-CM | POA: Diagnosis not present

## 2024-03-11 DIAGNOSIS — J449 Chronic obstructive pulmonary disease, unspecified: Secondary | ICD-10-CM | POA: Diagnosis not present

## 2024-03-17 ENCOUNTER — Telehealth: Payer: Self-pay

## 2024-03-17 DIAGNOSIS — F332 Major depressive disorder, recurrent severe without psychotic features: Secondary | ICD-10-CM

## 2024-03-17 NOTE — Patient Outreach (Signed)
 Submitted faxed referral for assistance.  [facility]  Population Health Care Management Assistant  Direct Dial: [phone]  Fax: [phone] Website: [email or web address]

## 2024-03-18 ENCOUNTER — Telehealth: Payer: Self-pay | Admitting: *Deleted

## 2024-03-18 NOTE — Progress Notes (Signed)
 Complex Care Management Note  Care Guide Note 03/18/2024 Name: Anne Juarez MRN: [id number] DOB: 06-27-1941  Anne Juarez is a 83 y.o. year old female who sees Wile, Nyoka Cowden, MD for primary care. I reached out to Anne Juarez by phone today to offer complex care management services.  [name] was given information about Complex Care Management services today including:   The Complex Care Management services include support from the care team which includes your Nurse Care Manager, Clinical Social Worker, or Pharmacist.  The Complex Care Management team is here to help remove barriers to the health concerns and goals most important to you. Complex Care Management services are voluntary, and the patient may decline or stop services at any time by request to their care team member.   Complex Care Management Consent Status: Patient agreed to services and verbal consent obtained.   Follow up plan:  Telephone appointment with complex care management team member scheduled for:  4/3  Encounter Outcome:  Patient Scheduled  Gwenevere Ghazi  [facility]  [facility] Guide  Direct Dial: [phone]  Fax [phone]

## 2024-03-20 ENCOUNTER — Other Ambulatory Visit: Payer: Self-pay

## 2024-03-20 NOTE — Patient Instructions (Signed)
 Visit Information  Thank you for taking time to visit with me today. Please don't hesitate to contact me if I can be of assistance to you before our next scheduled appointment.  Our next appointment is by telephone on Thursday, April 17 at 2:00pm  Please call the care guide team at [phone] if you need to cancel or reschedule your appointment.   I have attached an education sheet on Depression - just some good reminders on self-care, breathing exercises, journaling, etc.    Following is a copy of your care plan:   Goals Addressed             This Visit's Progress    CCM Expected Outcome:  Monitor, Self-Manage and Reduce Symptoms of Depression   New Goal    Current Barriers:  Knowledge Deficits related to plan of care for management of Depression   RNCM Clinical Goal(s):  Patient will verbalize understanding of plan for management of Depression as evidenced by taking medications as prescribed, reduction in PHQ9 by 9 weeks  through collaboration with RN Care manager, provider, and care team.   Interventions: -PHQ9 = 17, denies suicidal ideation, No difficulty getting along with others or completing chores at home.   Evaluation of current treatment plan related to  self management and patient's adherence to plan as established by provider Depression:   (Status:  New goal.)  Long Term Goal Evaluation of current treatment plan related to Depression, Limited social support, Family and relationship dysfunction, and Social Isolation self-management and patient's adherence to plan as established by provider. Discussed plans with patient for ongoing care management follow up and provided patient with direct contact information for care management team Evaluation of current treatment plan related to Depression and patient's adherence to plan as established by provider Reviewed medications with patient and discussed taking Welbutrin as prescribed, it may take up to 4-5 weeks to see some  results of medication, do NOT stop taking suddenly - first, speak to prescribing MD about tapering.  Provided patient with   educational materials related to Depression  Patient Goals/Self-Care Activities: Take all medications as prescribed Attend all scheduled provider appointments Call provider office for new concerns or questions  call the Suicide and Crisis Lifeline: 988 if experiencing a Mental Health or Behavioral Health Crisis   Follow Up Plan:  Telephone follow up appointment with care management team member scheduled for:  April 17, at 2:00pm.  The patient has been provided with contact information for the care management team and has been advised to call with any health related questions or concerns.               Please call the Suicide and Crisis Lifeline: 988 call the [place] National Suicide Prevention Lifeline: (351) 366-3949 or TTY: 615 561 2280 TTY 737-348-1715) to talk to a trained counselor if you are experiencing a Mental Health or Behavioral Health Crisis or need someone to talk to.  Patient verbalizes understanding of instructions and care plan provided today and agrees to view in MyChart. Active MyChart status and patient understanding of how to access instructions and care plan via MyChart confirmed with patient.     Jeani Hawking BSN, CCM [place]  [facility] Population Health RN Care Manager Direct Dial: [phone]  Fax: [phone]

## 2024-03-20 NOTE — Patient Outreach (Signed)
.  ccm Chronic Care Management   CCM RN Visit Note  03/20/2024 Name: Anne Juarez MRN: [id number] DOB: 05/07/1941  Subjective: Anne Juarez is a 83 y.o. year old female who is a primary care patient of Nadene Rubins, Nyoka Cowden, MD. The patient was referred to the Chronic Care Management team for assistance with care management needs subsequent to provider initiation of CCM services and plan of care.    Today's Visit:  Engaged with patient by telephone for initial visit.     SDOH Interventions Today    Flowsheet Row Most Recent Value  SDOH Interventions   Food Insecurity Interventions Intervention Not Indicated  Housing Interventions Intervention Not Indicated  Transportation Interventions Intervention Not Indicated  Utilities Interventions Intervention Not Indicated  Depression Interventions/Treatment  Medication  [She has been on Wellbutrin for 1.5 weeks, prescribed by Dr. Wile.]         Goals Addressed             This Visit's Progress    CCM Expected Outcome:  Monitor, Self-Manage and Reduce Symptoms of Depression   New Goal    Current Barriers:  Knowledge Deficits related to plan of care for management of Depression   RNCM Clinical Goal(s):  Patient will verbalize understanding of plan for management of Depression as evidenced by taking medications as prescribed, reduction in PHQ9 by 9 weeks  through collaboration with RN Care manager, provider, and care team.   Interventions: -PHQ9 = 17, denies suicidal ideation, No difficulty getting along with others or completing chores at home.   Evaluation of current treatment plan related to  self management and patient's adherence to plan as established by provider Depression:   (Status:  New goal.)  Long Term Goal Evaluation of current treatment plan related to Depression, Limited social support, Family and relationship dysfunction, and Social Isolation self-management and patient's adherence to plan as established by provider. Discussed  plans with patient for ongoing care management follow up and provided patient with direct contact information for care management team Evaluation of current treatment plan related to Depression and patient's adherence to plan as established by provider Reviewed medications with patient and discussed taking Welbutrin as prescribed, it may take up to 4-5 weeks to see some results of medication, do NOT stop taking suddenly - first, speak to prescribing MD about tapering.  Provided patient with   educational materials related to Depression  Patient Goals/Self-Care Activities: Take all medications as prescribed Attend all scheduled provider appointments Call provider office for new concerns or questions  call the Suicide and Crisis Lifeline: 988 if experiencing a Mental Health or Behavioral Health Crisis   Follow Up Plan:  Telephone follow up appointment with care management team member scheduled for:  April 17, at 2:00pm.  The patient has been provided with contact information for the care management team and has been advised to call with any health related questions or concerns.               Plan:Will perform repeat PHQ9 in 6 weeks.  Telephone follow up appointment with care management team member scheduled for:  April 17 at 2:00pm The patient has been provided with contact information for the care management team and has been advised to call with any health related questions or concerns.   Jeani Hawking BSN, CCM [place]  [facility]BC[facility] Population Health RN Care Manager Direct Dial: [phone]  Fax: [phone]

## 2024-04-03 ENCOUNTER — Other Ambulatory Visit: Payer: Self-pay

## 2024-04-03 DIAGNOSIS — Z1331 Encounter for screening for depression: Secondary | ICD-10-CM

## 2024-04-03 NOTE — Patient Outreach (Signed)
 Complex Care Management   Visit Note  04/03/2024  Name:  Anne Juarez MRN: [id number] DOB: Jul 10, 1941  Situation: Referral received for Complex Care Management related to  Depression  I obtained verbal consent from Patient.  Visit completed with patient  on the phone  Background:   Past Medical History:  Diagnosis Date   Anxiety    BMI 39.0-39.9,adult    COPD (chronic obstructive pulmonary disease) (HCC)    Depression    Diabetes mellitus without complication (HCC)    DM (diabetes mellitus) (HCC)    Fibromyalgia    Hyperlipidemia    Hypertension    Obesity    OSA (obstructive sleep apnea) 07/01/2013   Osteoarthritis (arthritis due to wear and tear of joints)    neck and knees,pelvic bone and left shoulder   Palpitations    Spondylolysis    Synovial cyst     Assessment: Patient Reported Symptoms:  Cognitive Cognitive Status: Alert and oriented to person, place, and time      Neurological      HEENT HEENT Symptoms Reported: No symptoms reported      Cardiovascular Cardiovascular Symptoms Reported: No symptoms reported    Respiratory Respiratory Symptoms Reported: No symptoms reported    Endocrine Patient reports the following symptoms related to hypoglycemia or hyperglycemia : No symptoms reported    Gastrointestinal Gastrointestinal Symptoms Reported: Diarrhea (Chronic Diarrhea, takes Imodium to treat, MD aware.)      Genitourinary      Integumentary Integumentary Symptoms Reported: No symptoms reported    Musculoskeletal Musculoskelatal Symptoms Reviewed: No symptoms reported        Psychosocial              03/20/2024    1:19 PM  Depression screen PHQ 2/9  Decreased Interest 3  Down, Depressed, Hopeless 3  PHQ - 2 Score 6  Altered sleeping 2  Tired, decreased energy 3  Change in appetite 0  Feeling bad or failure about yourself  3  Trouble concentrating 3  Moving slowly or fidgety/restless 0  Suicidal thoughts 0  PHQ-9 Score 17  Difficult  doing work/chores Not difficult at all    There were no vitals filed for this visit.  Medications Reviewed Today     Reviewed by Sherrye Payor, RN (Registered Nurse) on 04/03/24 at 1430  Med List Status: <None>   Medication Order Taking? Sig Documenting Provider Last Dose Status Informant  albuterol (VENTOLIN HFA) 108 (90 Base) MCG/ACT inhaler [id number]  Inhale 2 puffs into the lungs every 6 (six) hours as needed for wheezing. [provider]  Active Self  amLODipine (NORVASC) 5 MG tablet [id number]  Take 5 mg by mouth daily. [provider]  Active Self  atorvastatin (LIPITOR) 40 MG tablet [id number]  Take 40 mg by mouth daily. [provider]  Active Self  B Complex Vitamins (VITAMIN-B COMPLEX) TABS [id number]  Take 1 tablet by mouth daily. [provider]  Active Self  buPROPion (WELLBUTRIN XL) 150 MG 24 hr tablet [id number] Yes Take 150 mg by mouth daily. [provider] Taking Active Self  calcium-vitamin D (OSCAL WITH D) 500-5 MG-MCG tablet [id number]  Take 1 tablet by mouth daily with breakfast. [provider]  Active   FARXIGA 5 MG TABS tablet [id number]  Take 5 mg by mouth daily. [provider]  Active   Fluticasone-Salmeterol (ADVAIR) 100-50 MCG/DOSE AEPB [id number]  Inhale 1 puff into the lungs 2 (two) times daily. Parrett, Virgel Bouquet, NP  Active Self   Patient not taking:   Discontinued 10/10/20 1458 gabapentin (NEURONTIN) 100 MG capsule [id number] Yes Take 100 mg by mouth 2 (two) times daily as needed. [provider] Taking Active   guaifenesin (HUMIBID E) 400 MG TABS tablet [id number]  Take 400 mg by mouth. [provider]  Active   L-Lysine 1000 MG TABS [id number]  Take 1 tablet by mouth daily at 6 (six) AM. [provider]  Active   loperamide (IMODIUM) 2 MG capsule [id number]  Take 2 mg by mouth as needed. [provider]  Active   Magnesium 400 MG TABS [id number]  as directed Orally   Patient not taking: Reported on 03/20/2024   [provider]  Active   metoprolol tartrate (LOPRESSOR) 25 MG tablet [id number]  TAKE 1 TABLET (25 MG TOTAL) BY MOUTH DAILY. Nahser, Lela Purple, MD  Active   Multiple Vitamins-Minerals (CENTRUM SILVER PO) [id number]  Take 1 tablet by mouth daily after breakfast.  [provider]  Active Self  Omega-3 1000 MG CAPS [id number]  Take 1 capsule by mouth daily at 6 (six) AM. [provider]  Active    Patient not taking:   Discontinued 10/10/20 1458 pyridoxine (B-6) 100 MG tablet [id number]  Take 100 mg by mouth daily.  Patient not taking: Reported on 03/20/2024   [provider]  Active Self  telmisartan (MICARDIS) 80 MG tablet [id number]  Take 80 mg by mouth daily. [provider]  Active   telmisartan-hydrochlorothiazide (MICARDIS HCT) 80-25 MG per tablet [id number]  Take 1 tablet by mouth daily.  Patient not taking: Reported on 03/20/2024   [provider]  Active Self  Turmeric 500 MG CAPS [id number]  Take 1 capsule by mouth daily.  Patient not taking: Reported on 03/20/2024   [provider]  Active   vitamin C (ASCORBIC ACID) 500 MG tablet [id number]  Take 500 mg by mouth daily. [provider]  Active Self  vitamin E 400 UNIT capsule [id number]  Take 400 Units by mouth daily. [provider]  Active Self  zinc gluconate 50 MG tablet [id number]  Take 50 mg by mouth daily. [provider]  Active             Recommendation:   PHQ9 to be reassessed around May 15, last PHQ9=17 on 03/20/24.  Patient to keep taking bupropion as prescribed.   Follow Up Plan:   Telephone follow up appointment date/time:  05/01/24 at 2pm.  Jurline Olmsted BSN, CCM   [facility] RN Care Manager Direct Dial: [phone]  Fax: [phone]

## 2024-04-03 NOTE — Patient Instructions (Signed)
 Visit Information  Thank you for taking time to visit with me today. Please don't hesitate to contact me if I can be of assistance to you before our next scheduled appointment.  Your next care management appointment is by telephone with Lindi Revering, RN, on Thursday, May 15th at 2:00pm.    Please call the care guide team at [phone] if you need to cancel, schedule, or reschedule an appointment.   Please call the Suicide and Crisis Lifeline: 988 if you are experiencing a Mental Health or Behavioral Health Crisis or need someone to talk to.  Jurline Olmsted BSN, CCM [place]  VBCI Population Health RN Care Manager Direct Dial: [phone]  Fax: [phone]

## 2024-04-04 NOTE — Addendum Note (Signed)
 Addended by: Jurline Olmsted A on: 04/04/2024 03:29 PM   Modules accepted: Orders

## 2024-04-16 ENCOUNTER — Telehealth: Payer: Self-pay | Admitting: *Deleted

## 2024-04-16 NOTE — Progress Notes (Signed)
 Complex Care Management Care Guide Note  04/16/2024 Name: Anne Juarez MRN: [id number] DOB: Oct 02, 1941  Anne Juarez is a 83 y.o. year old female who is a primary care patient of Charolet Cope, Chales [place], MD and is actively engaged with the care management team. I reached out to Anne Juarez by phone today to assist with scheduling  with the Licensed Clinical Social Worker.  Follow up plan: Unsuccessful telephone outreach attempt made. A HIPAA compliant phone message was left for the patient providing contact information and requesting a return call.  Barnie Bora  [facility]  [facility], [facility] Guide  Direct Dial: [phone]  Fax [phone]

## 2024-04-22 DIAGNOSIS — N390 Urinary tract infection, site not specified: Secondary | ICD-10-CM | POA: Diagnosis not present

## 2024-04-22 DIAGNOSIS — E1159 Type 2 diabetes mellitus with other circulatory complications: Secondary | ICD-10-CM | POA: Diagnosis not present

## 2024-04-22 DIAGNOSIS — E1129 Type 2 diabetes mellitus with other diabetic kidney complication: Secondary | ICD-10-CM | POA: Diagnosis not present

## 2024-04-22 DIAGNOSIS — R300 Dysuria: Secondary | ICD-10-CM | POA: Diagnosis not present

## 2024-04-22 DIAGNOSIS — N182 Chronic kidney disease, stage 2 (mild): Secondary | ICD-10-CM | POA: Diagnosis not present

## 2024-04-24 NOTE — Progress Notes (Signed)
 Complex Care Management Note Care Guide Note  04/24/2024 Name: Anne Juarez MRN: [id number] DOB: 1941/11/18   Complex Care Management Outreach Attempts: A second unsuccessful outreach was attempted today to offer the patient with information about available complex care management services.  Follow Up Plan:  Additional outreach attempts will be made to offer the patient complex care management information and services.   Encounter Outcome:  No Answer  Barnie Bora  [facility]  [facility], [facility] Guide  Direct Dial: [phone]  Fax [phone]

## 2024-04-25 NOTE — Progress Notes (Signed)
 Complex Care Management Note Care Guide Note  04/25/2024 Name: ZONIA HORWEDEL MRN: [id number] DOB: Apr 30, 1941   Complex Care Management Outreach Attempts: A third unsuccessful outreach was attempted today to offer the patient with information about available complex care management services.  Follow Up Plan:  No further outreach attempts will be made at this time. We have been unable to contact the patient to offer or enroll patient in complex care management services.  Encounter Outcome:  No Answer  Barnie Bora  [facility]  [facility], [place] [facility] Guide  Direct Dial: [phone]  Fax [phone]

## 2024-05-01 ENCOUNTER — Other Ambulatory Visit: Payer: Self-pay

## 2024-05-27 ENCOUNTER — Telehealth: Payer: Self-pay

## 2024-05-27 NOTE — Progress Notes (Unsigned)
 Complex Care Management Care Guide Note  05/27/2024 Name: Anne Juarez MRN: [id number] DOB: Apr 22, 1941  Anne Juarez is a 83 y.o. year old female who is a primary care patient of Charolet Cope, [place]ales [place], MD and is actively engaged with the care management team. I reached out to Anne Juarez by phone today to assist with re-scheduling  with the RN Case Manager.  Follow up plan: Unsuccessful telephone outreach attempt made. A HIPAA compliant phone message was left for the patient providing contact information and requesting a return call.  .br

## 2024-05-28 NOTE — Progress Notes (Signed)
 Complex Care Management Care Guide Note  05/28/2024 Name: Anne Juarez MRN: [id number] DOB: 1941/10/06  Anne Juarez is a 83 y.o. year old female who is a primary care patient of Charolet Cope, [place], MD and is actively engaged with the care management team. I reached out to Anne Juarez by phone today to assist with re-scheduling  with the RN Case Manager.  Follow up plan: Unsuccessful telephone outreach attempt made. A HIPAA compliant phone message was left for the patient providing contact information and requesting a return call.  Creola Doheny [facility], [facility] Guide  Direct Dial: [phone]  Fax [phone]

## 2024-05-30 ENCOUNTER — Telehealth: Payer: Self-pay

## 2024-05-30 NOTE — Progress Notes (Unsigned)
 Complex Care Management Care Guide Note  05/30/2024 Name: KHAMORA KARAN MRN: [id number] DOB: 1941/12/18  Jannie Menghini is a 83 y.o. year old female who is a primary care patient of Charolet Cope, [place], MD and is actively engaged with the care management team. I reached out to Jannie Menghini by phone today to assist with re-scheduling  with the RN Case Manager.  Follow up plan: Unsuccessful telephone outreach attempt made. A HIPAA compliant phone message was left for the patient providing contact information and requesting a return call.  Creola Doheny [facility], [facility] Guide  Direct Dial: [phone]  Fax [phone]

## 2024-06-02 NOTE — Progress Notes (Signed)
 Complex Care Management Care Guide Note  06/02/2024 Name: [name] MRN: [id number] DOB: 01-Aug-1941  Anne Juarez is a 83 y.o. year old female who is a primary care patient of Charolet Cope, [place], MD and is actively engaged with the care management team. I reached out to Anne Juarez by phone today to assist with re-scheduling  with the RN Case Manager.  Follow up plan: Unsuccessful telephone outreach attempt made. A HIPAA compliant phone message was left for the patient providing contact information and requesting a return call.  Creola Doheny [facility], [facility] Guide  Direct Dial: [phone]  Fax [phone]

## 2024-07-15 DIAGNOSIS — N1831 Chronic kidney disease, stage 3a: Secondary | ICD-10-CM | POA: Diagnosis not present

## 2024-07-15 DIAGNOSIS — I130 Hypertensive heart and chronic kidney disease with heart failure and stage 1 through stage 4 chronic kidney disease, or unspecified chronic kidney disease: Secondary | ICD-10-CM | POA: Diagnosis not present

## 2024-07-15 DIAGNOSIS — E1122 Type 2 diabetes mellitus with diabetic chronic kidney disease: Secondary | ICD-10-CM | POA: Diagnosis not present

## 2024-07-15 DIAGNOSIS — E1159 Type 2 diabetes mellitus with other circulatory complications: Secondary | ICD-10-CM | POA: Diagnosis not present

## 2024-07-15 DIAGNOSIS — Z6839 Body mass index (BMI) 39.0-39.9, adult: Secondary | ICD-10-CM | POA: Diagnosis not present

## 2024-07-15 DIAGNOSIS — J449 Chronic obstructive pulmonary disease, unspecified: Secondary | ICD-10-CM | POA: Diagnosis not present

## 2024-07-15 DIAGNOSIS — I5032 Chronic diastolic (congestive) heart failure: Secondary | ICD-10-CM | POA: Diagnosis not present

## 2024-07-25 DIAGNOSIS — R300 Dysuria: Secondary | ICD-10-CM | POA: Diagnosis not present

## 2024-07-25 DIAGNOSIS — R350 Frequency of micturition: Secondary | ICD-10-CM | POA: Diagnosis not present

## 2024-07-25 DIAGNOSIS — R8290 Unspecified abnormal findings in urine: Secondary | ICD-10-CM | POA: Diagnosis not present

## 2024-07-25 DIAGNOSIS — N390 Urinary tract infection, site not specified: Secondary | ICD-10-CM | POA: Diagnosis not present

## 2024-07-25 DIAGNOSIS — M549 Dorsalgia, unspecified: Secondary | ICD-10-CM | POA: Diagnosis not present

## 2024-08-05 DIAGNOSIS — E1159 Type 2 diabetes mellitus with other circulatory complications: Secondary | ICD-10-CM | POA: Diagnosis not present

## 2024-08-05 DIAGNOSIS — J069 Acute upper respiratory infection, unspecified: Secondary | ICD-10-CM | POA: Diagnosis not present

## 2024-08-05 DIAGNOSIS — R5383 Other fatigue: Secondary | ICD-10-CM | POA: Diagnosis not present

## 2024-08-05 DIAGNOSIS — R051 Acute cough: Secondary | ICD-10-CM | POA: Diagnosis not present

## 2024-08-05 DIAGNOSIS — W19XXXA Unspecified fall, initial encounter: Secondary | ICD-10-CM | POA: Diagnosis not present

## 2024-08-05 DIAGNOSIS — R0602 Shortness of breath: Secondary | ICD-10-CM | POA: Diagnosis not present

## 2024-08-05 DIAGNOSIS — J449 Chronic obstructive pulmonary disease, unspecified: Secondary | ICD-10-CM | POA: Diagnosis not present

## 2024-08-05 DIAGNOSIS — M25562 Pain in left knee: Secondary | ICD-10-CM | POA: Diagnosis not present

## 2024-08-05 DIAGNOSIS — Z1152 Encounter for screening for COVID-19: Secondary | ICD-10-CM | POA: Diagnosis not present

## 2024-10-29 DIAGNOSIS — E785 Hyperlipidemia, unspecified: Secondary | ICD-10-CM | POA: Diagnosis not present

## 2024-10-29 DIAGNOSIS — R82998 Other abnormal findings in urine: Secondary | ICD-10-CM | POA: Diagnosis not present

## 2024-10-29 DIAGNOSIS — Z6836 Body mass index (BMI) 36.0-36.9, adult: Secondary | ICD-10-CM | POA: Diagnosis not present

## 2024-10-29 DIAGNOSIS — E1159 Type 2 diabetes mellitus with other circulatory complications: Secondary | ICD-10-CM | POA: Diagnosis not present

## 2024-10-29 DIAGNOSIS — F332 Major depressive disorder, recurrent severe without psychotic features: Secondary | ICD-10-CM | POA: Diagnosis not present

## 2024-10-29 DIAGNOSIS — J449 Chronic obstructive pulmonary disease, unspecified: Secondary | ICD-10-CM | POA: Diagnosis not present

## 2024-10-29 DIAGNOSIS — K529 Noninfective gastroenteritis and colitis, unspecified: Secondary | ICD-10-CM | POA: Diagnosis not present

## 2024-10-29 DIAGNOSIS — I5032 Chronic diastolic (congestive) heart failure: Secondary | ICD-10-CM | POA: Diagnosis not present

## 2024-10-29 DIAGNOSIS — N1831 Chronic kidney disease, stage 3a: Secondary | ICD-10-CM | POA: Diagnosis not present

## 2024-10-29 DIAGNOSIS — E1122 Type 2 diabetes mellitus with diabetic chronic kidney disease: Secondary | ICD-10-CM | POA: Diagnosis not present

## 2024-10-29 DIAGNOSIS — Z23 Encounter for immunization: Secondary | ICD-10-CM | POA: Diagnosis not present

## 2024-10-29 DIAGNOSIS — I2510 Atherosclerotic heart disease of native coronary artery without angina pectoris: Secondary | ICD-10-CM | POA: Diagnosis not present

## 2024-10-29 DIAGNOSIS — I130 Hypertensive heart and chronic kidney disease with heart failure and stage 1 through stage 4 chronic kidney disease, or unspecified chronic kidney disease: Secondary | ICD-10-CM | POA: Diagnosis not present

## 2024-11-20 ENCOUNTER — Ambulatory Visit: Attending: Internal Medicine | Admitting: Internal Medicine

## 2024-11-20 ENCOUNTER — Encounter: Payer: Self-pay | Admitting: Internal Medicine

## 2024-11-20 VITALS — BP 131/59 | HR 80 | Ht 62.0 in | Wt 201.2 lb

## 2024-11-20 DIAGNOSIS — R0989 Other specified symptoms and signs involving the circulatory and respiratory systems: Secondary | ICD-10-CM

## 2024-11-20 DIAGNOSIS — M549 Dorsalgia, unspecified: Secondary | ICD-10-CM | POA: Diagnosis not present

## 2024-11-20 DIAGNOSIS — I5032 Chronic diastolic (congestive) heart failure: Secondary | ICD-10-CM

## 2024-11-20 DIAGNOSIS — R309 Painful micturition, unspecified: Secondary | ICD-10-CM | POA: Diagnosis not present

## 2024-11-20 DIAGNOSIS — R350 Frequency of micturition: Secondary | ICD-10-CM | POA: Diagnosis not present

## 2024-11-20 DIAGNOSIS — G4733 Obstructive sleep apnea (adult) (pediatric): Secondary | ICD-10-CM

## 2024-11-20 DIAGNOSIS — R002 Palpitations: Secondary | ICD-10-CM

## 2024-11-20 DIAGNOSIS — R300 Dysuria: Secondary | ICD-10-CM | POA: Diagnosis not present

## 2024-11-20 NOTE — Progress Notes (Signed)
 Cardiology Office Note   Date:  11/20/2024  ID:  Anne Juarez, Anne Juarez 04-28-1941, MRN [id number] PCP: Stephane Leita DEL, MD  [facility] Providers Cardiologist:  Emeline FORBES Calender, DO     History of Present Illness Anne Juarez is a 83 y.o. female who previously followed with Dr. Alveta with a past medical history of severe OSA, brief paroxysmal SVT on Zio patch, anxiety, COPD, diabetes, fibromyalgia, hypertension, hyperlipidemia, chronic diastolic heart failure who presents today for annual follow-up.  Overall she is doing well and has not had any recurrent palpitations or worsening shortness of breath.  On occasion she notes some dizziness but has not syncopized.  This occurs sporadically without seeming etiology.  Otherwise no complaints.  ROS:  Review of Systems  All other systems reviewed and are negative.   Physical Exam  Physical Exam Vitals and nursing note reviewed.  Constitutional:      Appearance: Normal appearance.  HENT:     Head: Normocephalic and atraumatic.  Eyes:     Conjunctiva/sclera: Conjunctivae normal.  Neck:     Vascular: Carotid bruit present.     Comments: Right-sided carotid bruit Cardiovascular:     Rate and Rhythm: Normal rate and regular rhythm.  Pulmonary:     Effort: Pulmonary effort is normal.     Breath sounds: Normal breath sounds.  Musculoskeletal:        General: No swelling or tenderness.  Skin:    Coloration: Skin is not jaundiced or pale.  Neurological:     Mental Status: She is alert.     VS:  BP (!) 131/59   Pulse 80   Ht 5' 2 (1.575 m)   Wt 201 lb 3.2 oz (91.3 kg)   SpO2 96%   BMI 36.80 kg/m         Wt Readings from Last 3 Encounters:  11/20/24 201 lb 3.2 oz (91.3 kg)  10/30/23 216 lb 6.4 oz (98.2 kg)  10/31/22 217 lb (98.4 kg)     EKG Interpretation Date/Time:  Thursday November 20 2024 13:32:24 EST Ventricular Rate:  83 PR Interval:  138 QRS Duration:  96 QT Interval:  400 QTC Calculation: 470 R  Axis:   36  Text Interpretation: Sinus rhythm with occasional Premature ventricular complexes Possible Inferior infarct , age undetermined Cannot rule out Anterior infarct , age undetermined When compared with ECG of 30-Oct-2023 10:48, Premature ventricular complexes are now Present Borderline criteria for Inferior infarct are now Present Confirmed by Calender Emeline [id number]) on 11/20/2024 1:34:37 PM    Studies Reviewed   Prior CV Studies: Echocardiogram 03/02/2022:   1. Left ventricular ejection fraction, by estimation, is >75%. The left  ventricle has hyperdynamic function. The left ventricle has no regional  wall motion abnormalities. There is moderate concentric left ventricular  hypertrophy. Left ventricular  diastolic parameters are indeterminate.   2. Right ventricular systolic function is normal. The right ventricular  size is normal. There is normal pulmonary artery systolic pressure.   3. Left atrial size was mild to moderately dilated.   4. Right atrial size was moderately dilated.   5. The mitral valve was not well visualized. Trivial mitral valve  regurgitation. No evidence of mitral stenosis. Severe mitral annular  calcification.   6. The aortic valve was not well visualized. There is moderate  calcification of the aortic valve. There is mild thickening of the aortic  valve. Aortic valve regurgitation is trivial. Aortic valve sclerosis is  present, with no  evidence of aortic valve  stenosis.   7. The inferior vena cava is normal in size with greater than 50%  respiratory variability, suggesting right atrial pressure of 3 mmHg.   Comparison(s): No significant change from prior study.     LONG TERM MONITOR (8-14 DAYS) INTERPRETATION 03/24/2022  Narrative  Sinus rhythm  Frequent episodes of nonsustained SVT  No symptoms reported during monitor time   Patch Wear Time:  13 days and 11 hours (2023-03-17T09:09:26-0400 to 2023-03-30T20:37:32-0400)  Patient had a min HR  of 57 bpm, max HR of 207 bpm, and avg HR of 80 bpm. Predominant underlying rhythm was Sinus Rhythm. 2154 Supraventricular Tachycardia runs occurred, the run with the fastest interval lasting 5 beats with a max rate of 207 bpm, the longest lasting 11.9 secs with an avg rate of 122 bpm. Idioventricular Rhythm was present. Isolated SVEs were frequent (14.3%, [id number]), SVE Couplets were occasional (2.0%, 14561), and SVE Triplets were occasional (1.5%, 7044). Isolated VEs were rare (<1.0%, 5653), VE Couplets were rare (<1.0%, 42), and VE Triplets were rare (<1.0%, 2).      Risk Assessment/Calculations             ASCVD risk score: The ASCVD Risk score (Arnett DK, et al., 2019) failed to calculate for the following reasons:   The 2019 ASCVD risk score is only valid for ages 58 to 19   ASSESSMENT  Chronic HFpEF stable Right-sided carotid bruit with dizziness  Paroxysmal nonsustained SVT brief episodes of nonsustained SVT noted on a 2-week monitor in 2023.  Currently on metoprolol  tartrate 25 mg. Severe OSA Hypertension Hyperlipidemia lipid panel 01/03/2024: Total cholesterol 199, triglycerides 130, HDL 70, LDL 103 Diabetes   Plan  Will check carotid ultrasound Continue with CPAP machine Continue with metoprolol   Follow up: 1 year pending above workup          Signed, Emeline FORBES Calender, DO

## 2024-11-20 NOTE — Patient Instructions (Signed)
 Medication Instructions:  No medication changes were made at this visit. Continue current regimen.  *If you need a refill on your cardiac medications before your next appointment, please call your pharmacy*  Testing/Procedures: Your physician has requested that you have a carotid duplex. This test is an ultrasound of the carotid arteries in your neck. It looks at blood flow through these arteries that supply the brain with blood. Allow one hour for this exam. There are no restrictions or special instructions.   Follow-Up: At [facility], you and your health needs are our priority.  As part of our continuing mission to provide you with exceptional heart care, our providers are all part of one team.  This team includes your primary Cardiologist (physician) and Advanced Practice Providers or APPs (Physician Assistants and Nurse Practitioners) who all work together to provide you with the care you need, when you need it.  Your next appointment:   1 year(s)  Provider:   Emeline FORBES Calender, DO

## 2024-11-24 ENCOUNTER — Other Ambulatory Visit: Payer: Self-pay

## 2024-11-27 MED ORDER — METOPROLOL TARTRATE 25 MG PO TABS: 25.0000 mg | ORAL_TABLET | Freq: Every day | ORAL | 3 refills | Status: AC

## 2024-12-04 ENCOUNTER — Ambulatory Visit (HOSPITAL_COMMUNITY): Admission: RE | Admit: 2024-12-04 | Discharge: 2024-12-04 | Attending: Internal Medicine

## 2024-12-04 DIAGNOSIS — R0989 Other specified symptoms and signs involving the circulatory and respiratory systems: Secondary | ICD-10-CM | POA: Insufficient documentation

## 2024-12-05 ENCOUNTER — Ambulatory Visit: Payer: Self-pay | Admitting: Internal Medicine

## 2024-12-10 MED ORDER — ASPIRIN 81 MG PO TBEC: 81.0000 mg | DELAYED_RELEASE_TABLET | Freq: Every day | ORAL | Status: AC

## 2025-01-05 DIAGNOSIS — N390 Urinary tract infection, site not specified: Secondary | ICD-10-CM | POA: Diagnosis not present
